# Patient Record
Sex: Male | Born: 1955 | Race: White | Hispanic: No | Marital: Married | Smoking: Never smoker
Health system: Southern US, Community
[De-identification: ages and names within clinical notes are randomized; demographics above are authoritative.]

## PROBLEM LIST (undated history)

## (undated) DIAGNOSIS — M1990 Unspecified osteoarthritis, unspecified site: Secondary | ICD-10-CM

## (undated) DIAGNOSIS — N200 Calculus of kidney: Secondary | ICD-10-CM

## (undated) DIAGNOSIS — E0590 Thyrotoxicosis, unspecified without thyrotoxic crisis or storm: Secondary | ICD-10-CM

## (undated) HISTORY — PX: ANKLE ARTHROPLASTY: SUR68

## (undated) HISTORY — PX: CARPAL TUNNEL RELEASE: SHX101

## (undated) HISTORY — DX: Thyrotoxicosis, unspecified without thyrotoxic crisis or storm: E05.90

## (undated) HISTORY — DX: Calculus of kidney: N20.0

## (undated) HISTORY — DX: Unspecified osteoarthritis, unspecified site: M19.90

## (undated) HISTORY — PX: TOTAL SHOULDER ARTHROPLASTY: SHX126

## (undated) HISTORY — PX: KNEE ARTHROSCOPY: SUR90

## (undated) HISTORY — PX: KNEE ARTHROPLASTY: SHX992

## (undated) HISTORY — PX: JOINT REPLACEMENT: SHX530

---

## 2002-07-14 ENCOUNTER — Encounter: Payer: Self-pay | Admitting: Rheumatology

## 2002-07-14 ENCOUNTER — Ambulatory Visit (HOSPITAL_COMMUNITY): Admission: RE | Admit: 2002-07-14 | Discharge: 2002-07-14 | Payer: Self-pay | Admitting: Rheumatology

## 2005-05-12 ENCOUNTER — Ambulatory Visit (HOSPITAL_BASED_OUTPATIENT_CLINIC_OR_DEPARTMENT_OTHER): Admission: RE | Admit: 2005-05-12 | Discharge: 2005-05-12 | Payer: Self-pay | Admitting: Orthopedic Surgery

## 2006-09-09 ENCOUNTER — Encounter: Admission: RE | Admit: 2006-09-09 | Discharge: 2006-09-09 | Payer: Self-pay | Admitting: Rheumatology

## 2006-10-04 ENCOUNTER — Ambulatory Visit (HOSPITAL_BASED_OUTPATIENT_CLINIC_OR_DEPARTMENT_OTHER): Admission: RE | Admit: 2006-10-04 | Discharge: 2006-10-04 | Payer: Self-pay | Admitting: Orthopedic Surgery

## 2010-09-20 NOTE — Op Note (Signed)
NAMEJEFFERIE, [name]                ACCOUNT NO.:  [id number]   MEDICAL RECORD NO.:  [id number]          PATIENT TYPE:  AMB   LOCATION:  DSC                          FACILITY:  [facility]   PHYSICIAN:  Rodney A. Mortenson, M.D.DATE OF BIRTH:  1955-07-16   DATE OF PROCEDURE:  10/04/2006  DATE OF DISCHARGE:                               OPERATIVE REPORT   PREOPERATIVE DIAGNOSES:  Right carpal tunnel syndrome; osteoarthritis  right acromioclavicular joint; impingement syndrome right shoulder with  secondary supraspinatus tendinitis and bursitis right shoulder.   PROCEDURE:  Release of right carpal tunnel; diagnostic arthroscopy right  shoulder with arthroscopic acromioplasty; right distal clavicle  resection through the arthroscope.   SURGEON:  Lenard Galloway. Chaney Malling, M.D.   ASSISTANT:  Arlys John D. Petrarca, P.A.-C.   ANESTHESIA:  General.   PROCEDURE:  The patient was placed on the operating table in the supine  position.  After satisfactory anesthesia, the patient was placed in the  semi-sitting position.  The entire right upper extremity including the  hand all the way up to the shoulder girdle was prepped with Betadine and  draped out in the usual manner.  Attention was first turned to the arm.  A sterile tourniquet was placed above the elbow and the hand was put on  a Mayo stand.  The arm was then wrapped out with an Esmarch and the  tourniquet was elevated.  Loupe magnification was used throughout.  A  lazy-S incision was started in the mid palmar space and carried  proximally to the volar wrist crease on the ulnar side of the midline.  Skin edges were retracted.  The fascia was opened and the median nerve  was identified and isolated.  A curved Mayo scissors was placed under  the transverse ligament but above the median nerve.  Using sharp  dissection the transverse ligament was then released off the ulnar  border of the carpal canal under direct vision throughout.  Complete  decompression of the transverse ligament was achieved.  There was loss  of vascular markings of the nerve underneath the transverse ligament,  but no significant compression of the nerve itself.  No other space-  occupying lesions were seen.  Skin edges were then closed with  interrupted 4-0 nylon suture.  Sterile dressings were applied and an  impervious stockinette was placed on the hand all way the way up to the  elbow and wrapped with a Coban.   Attention was now turned to the shoulder for the second operative  procedure.  An arthroscope was placed in the standard posterior portal,  and the glenohumeral joint was examined very carefully.  The articular  cartilage of the humeral head and the glenoid appeared normal.  The  entire glenoid labrum and the biceps tendon were intact.  The  subscapularis appeared normal.  There was some fraying of the  undersurface of the supraspinatus but no through-and-through thickness  tear was seen.  The arthroscope was then placed the subacromial space.  The bursa in the subacromial space was then cleaned out through a  lateral portal using the Arthrotek wand.  There was definite fraying of  the supraspinatus tendon in this area with a secondary bursitis.  No  through-and-through tear was seen.  The distal clavicle was then  isolated, and a needle was placed through the [facility] joint and the anterior  aspect of the acromion to set up appropriate landmarks.  Using a high-  speed bur a generous acromioplasty was then done.  This was swept  anteriorly and medially so the [facility] joint could clearly be seen.  Excellent decompression was achieved.  Bleeding was easily controlled  with the Arthrotek wand.  After adequate for exposure of the distal  clavicle and release of all the capsule around the distal clavicle, it  was felt that resection of the clavicle could be done through an  anterior portal.  An anterior portal was made and a 6-mm bur was  inserted.  Starting  anteriorly and working posteriorly from inferior to  superior, the 6-mm bur was used to resect the distal end of the  clavicle.  The arthroscope was changed to a lateral portal and then the  anterior portal to check completeness of the resection, and excellent  decompression of the distal clavicle was achieved.  There was a nice  trough between the distal clavicle and the acromion.  The capsule was  still stable dorsally.  The area of resection could be palpated through  the skin.  I was very pleased with the surgical outcome.  At this point  the scope was removed.  The wounds were closed with 4-0 nylon suture.  Sterile dressing were applied and the patient returned to the recovery  room in excellent condition.  Technically, the procedure went extremely  well.   Two surgical procedures were done at the same sitting as noted above.  The patient was returned to the recovery room in excellent condition.  Technically both procedures went extremely well.   DRAINS:  None.   COMPLICATIONS:  None.           ______________________________  Lenard Galloway. Chaney Malling, M.D.     RAM/MEDQ  D:  10/04/2006  T:  10/04/2006  Job:  [id number]

## 2010-09-23 NOTE — Op Note (Signed)
[name], [name]                ACCOUNT NO.:  [id number]   MEDICAL RECORD NO.:  [id number]          PATIENT TYPE:  AMB   LOCATION:  DSC                          FACILITY:  [facility]   PHYSICIAN:  Rodney A. Mortenson, M.D.DATE OF BIRTH:  [birth year]   DATE OF PROCEDURE:  05/12/2005  DATE OF DISCHARGE:                                 OPERATIVE REPORT   PREOPERATIVE DIAGNOSIS:  Nondisplaced full thickness rotator cuff tear, left  distal infraspinatus tendon, frayed superior labrum, left shoulder.   POSTOPERATIVE DIAGNOSIS:  Nondisplaced full thickness rotator cuff tear,  left distal infraspinatus tendon, frayed superior labrum, left shoulder.   OPERATION:  Arthroscopic evaluation of glenohumeral joint, debridement  superior labrum, open acromioplasty and open repair of degenerated torn  rotator cuff, left shoulder.   SURGEON:  Lenard Galloway. Chaney Malling, M.D.   ANESTHESIA:  General.   PROCEDURE:  The patient was placed on the operating table in supine  position.  The patient was then placed in a semi-seated position and the  left upper extremity was prepped with DuraPrep and draped out in the usual  manner.  Through the standard posterior portal, the arthroscope was  introduced.  The articular cartilage over the humeral head and glenoid  appeared normal.  The biceps tendon appeared normal but the biceps anchor  was frayed and torn but was not unstable.  The anterior labrum was then  examined, this was palpated and no tears or no instability about the labrum  were seen, but again, the superior part was quite frayed and torn up.  The  arthroscope was then passed laterally and the under surface of the  supraspinatus showed marked fraying and tearing.  Through an anterior  operative portal, an intra-articular shaver was introduced and the superior  labrum was debrided and the under surface of the infraspinatus was also  debrided.  With a spinal needle passed through the skin and into the  joint,  the area of the frayed and torn portion of the rotator cuff was marked with  the large spinal needle.  The arthroscope was removed from the shoulder.  A  saber cut incision was made over the anterolateral aspect of the shoulder.  The skin edges were retracted.  The fibrous deltoid released off the  anterior aspect of the acromion only.  The subacromial space was then  opened.  Using a power saw, a Neer acromioplasty was then done.  Once this  was accomplished, excellent access to the joint could be achieved.  Before  the subacromial space was opened, the arthroscope was placed in the  subacromial area and there was fraying of the infraspinatus in the area  where the needle had been placed through the skin and muscle into the intra-  articular portion.  This showed fraying and tearing that actually was full  thickness but not displaced.  The tear was seen at both surfaces, both the  articular surface and the bursal surface.  Again, once the shoulder was  opened, excellent access was achieved.  The spinal needle was in the  position of fraying and tearing.  The spinal needle was then removed.  A  portion of the rotator cuff at this point was then elliptically excised and  fraying and tearing and degenerative changes of the cuff were seen.  The  cuff was then sutured side-to-side with 0 Tycron sutures.  A watertight  closure was achieved.  No other significant pathology was seen.  The  shoulder was copiously irrigated with saline solution.  The deltoid fibers  were reattached with 0 Vicryl.  2-0 Vicryl was used to close the subcutaneous tissues and stainless steel  staples were used to close the skin.  A sterile dressing was applied.  The  patient returned to the recovery room in excellent condition.  Technically,  this procedure went extremely well.  Drains were none.  Complications none.           ______________________________  Lenard Galloway. Chaney Malling, M.D.     RAM/MEDQ  D:   05/12/2005  T:  05/12/2005  Job:  [id number]

## 2015-11-02 DIAGNOSIS — F339 Major depressive disorder, recurrent, unspecified: Secondary | ICD-10-CM | POA: Insufficient documentation

## 2015-11-02 DIAGNOSIS — M0600 Rheumatoid arthritis without rheumatoid factor, unspecified site: Secondary | ICD-10-CM | POA: Insufficient documentation

## 2015-11-02 DIAGNOSIS — R5381 Other malaise: Secondary | ICD-10-CM | POA: Insufficient documentation

## 2015-11-02 DIAGNOSIS — R5383 Other fatigue: Secondary | ICD-10-CM | POA: Insufficient documentation

## 2016-02-18 ENCOUNTER — Other Ambulatory Visit: Payer: Self-pay | Admitting: Orthopedic Surgery

## 2016-02-18 DIAGNOSIS — M1711 Unilateral primary osteoarthritis, right knee: Secondary | ICD-10-CM

## 2016-02-25 ENCOUNTER — Ambulatory Visit
Admission: RE | Admit: 2016-02-25 | Discharge: 2016-02-25 | Disposition: A | Discharge status: Home / Self Care | Payer: BC Managed Care – PPO | Source: Ambulatory Visit | Attending: Orthopedic Surgery | Admitting: Orthopedic Surgery

## 2016-02-25 DIAGNOSIS — M1711 Unilateral primary osteoarthritis, right knee: Secondary | ICD-10-CM

## 2016-04-13 DIAGNOSIS — G5602 Carpal tunnel syndrome, left upper limb: Secondary | ICD-10-CM | POA: Insufficient documentation

## 2016-08-22 DIAGNOSIS — M150 Primary generalized (osteo)arthritis: Secondary | ICD-10-CM | POA: Insufficient documentation

## 2016-08-22 DIAGNOSIS — M159 Polyosteoarthritis, unspecified: Secondary | ICD-10-CM | POA: Insufficient documentation

## 2017-01-10 IMAGING — CT CT ANKLE*R* W/O CM
2 of 5 series · 6 of 14 positions shown, 7 images · non-contrast
Comparison: None.

CLINICAL DATA: Right knee pain for years. Remote arthroscopic
surgery in 6555. Primary osteoarthritis, preoperative Conformis
protocol.

EXAM:
CT OF THE RIGHT ANKLE WITHOUT CONTRAST (LIMITED)
CT OF THE RIGHT KNEE WITHOUT CONTRAST
CT OF THE RIGHT HIP WITHOUT CONTRAST (LIMITED)
TECHNIQUE: Multidetector CT imaging of the right hip, knee, and ankle were
performed. Multiplanar reconstruction of the knee images were also
employed.

[Series 9: knee soft · axial · 0.39mm/px · z∈[-666,-506]mm · 3 of 130 slices shown]
[im 33/130  soft-tissue]
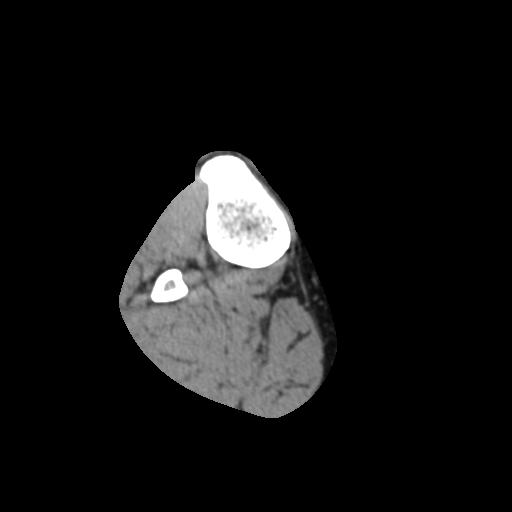
[im 65/130  soft-tissue]
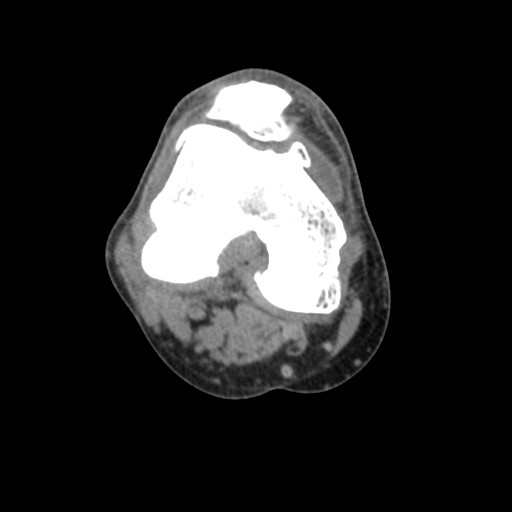
[im 97/130  soft-tissue]
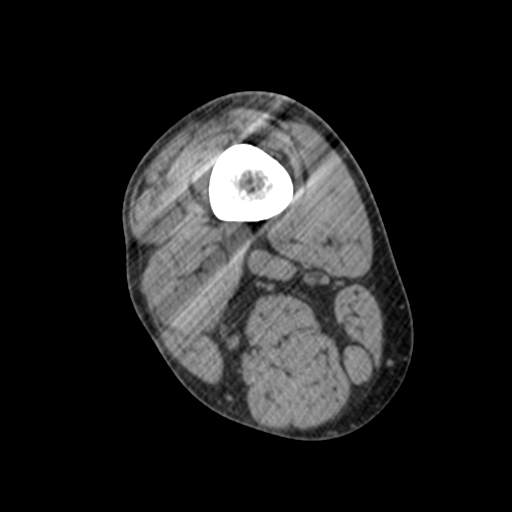

[Series 301: cor · axial · 0.39mm/px · z∈[-586,-420]mm · 3 of 75 slices shown, 4 images]
[im 1/75  soft-tissue]
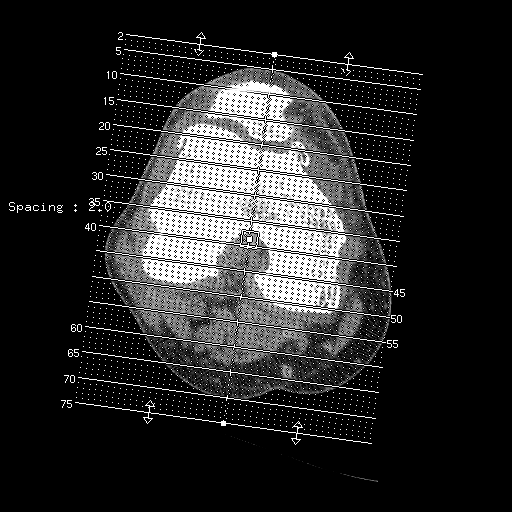
[im 1/75  bone]
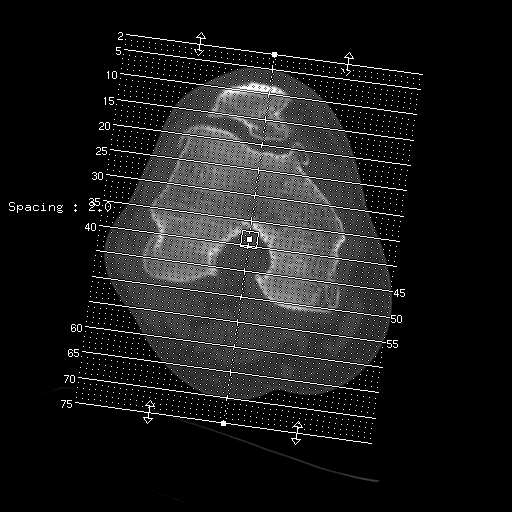
[im 38/75  bone]
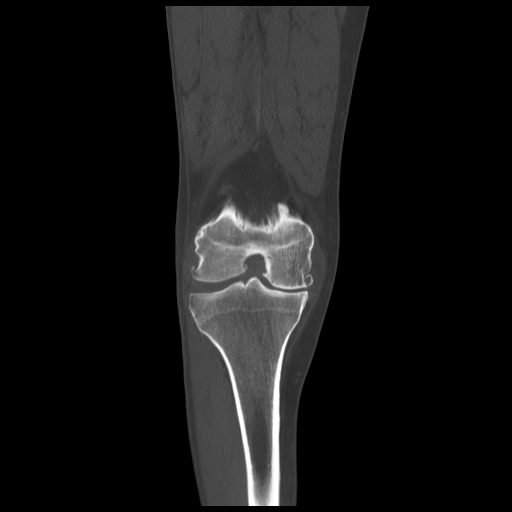
[im 75/75  bone]
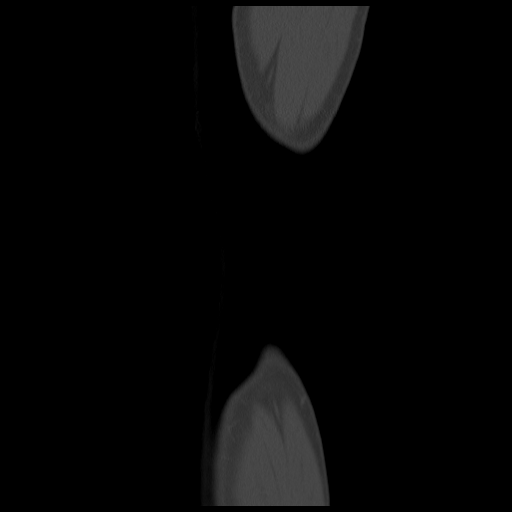

[6 of 14 positions shown; findings below may reference images not displayed]

FINDINGS: Right hip:

Mild degenerative arthropathy with spurring along the humeral head
and acetabulum. No hip effusion. Right inguinal mesh noted, there is
a indirect inguinal hernia extending along the medial margin of the
mesh containing adipose tissue, images 6-18 of series 5.

Right ankle:

Tunnel or unusual cystic lesion along the lateral malleolus,
correlate with operative history. Mildly fragmented spurring along
the tip of the medial malleolus. Probable peroneus tendinopathy or
tenosynovitis adjacent to the lateral malleolus.

Right knee: Striking tricompartmental spurring with markedly severe
loss of articular cartilage in the medial compartment and chondral
thinning also in the patellofemoral joint. Faint meniscal
chondrocalcinosis both both the medial and lateral menisci. Probable
erosive lesion with some cortical violation posteriorly in the
medial portion of the tibial plateau. Surrounding spurring noted.

Small to moderate knee effusion. Integrity ACL is uncertain.
Suspected pes anserine bursitis.
IMPRESSION: 1. Cystic lesion or erosion in the posterior aspect of the medial
tibial plateau/medial tibial metaphysis, near the semimembranosus
insertion site, with surrounding spurring.
2. Knee effusion with mild pes anserine bursitis.
3. Chondrocalcinosis with severe tricompartmental spurring, and loss
of articular space markedly severe in the medial compartment.
4. Right hip: There are mild degenerative findings, in addition to a
small recurrent indirect inguinal hernia extending along the lateral
margin of the mesh and containing adipose tissue.
5. Ankle: Tunnel or erosive/cystic lesion of the lateral malleolus,
correlate with any prior operative intervention in this vicinity.
Probable peroneus tendinopathy or tenosynovitis.

## 2017-01-15 DIAGNOSIS — M1990 Unspecified osteoarthritis, unspecified site: Secondary | ICD-10-CM | POA: Insufficient documentation
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
## 2017-01-23 ENCOUNTER — Ambulatory Visit (INDEPENDENT_AMBULATORY_CARE_PROVIDER_SITE_OTHER): Payer: BC Managed Care – PPO

## 2017-01-23 ENCOUNTER — Telehealth: Payer: Self-pay | Admitting: Radiology

## 2017-01-23 ENCOUNTER — Ambulatory Visit (INDEPENDENT_AMBULATORY_CARE_PROVIDER_SITE_OTHER): Payer: Self-pay

## 2017-01-23 ENCOUNTER — Ambulatory Visit (INDEPENDENT_AMBULATORY_CARE_PROVIDER_SITE_OTHER): Payer: BC Managed Care – PPO | Admitting: Rheumatology

## 2017-01-23 ENCOUNTER — Encounter: Payer: Self-pay | Admitting: Rheumatology

## 2017-01-23 VITALS — BP 150/82 | HR 78 | Resp 16 | Ht 70.0 in | Wt 176.0 lb

## 2017-01-23 DIAGNOSIS — N200 Calculus of kidney: Secondary | ICD-10-CM

## 2017-01-23 DIAGNOSIS — M19041 Primary osteoarthritis, right hand: Secondary | ICD-10-CM

## 2017-01-23 DIAGNOSIS — F419 Anxiety disorder, unspecified: Secondary | ICD-10-CM

## 2017-01-23 DIAGNOSIS — M79642 Pain in left hand: Secondary | ICD-10-CM

## 2017-01-23 DIAGNOSIS — M79672 Pain in left foot: Secondary | ICD-10-CM

## 2017-01-23 DIAGNOSIS — M79671 Pain in right foot: Secondary | ICD-10-CM | POA: Diagnosis not present

## 2017-01-23 DIAGNOSIS — M1990 Unspecified osteoarthritis, unspecified site: Secondary | ICD-10-CM | POA: Diagnosis not present

## 2017-01-23 DIAGNOSIS — F329 Major depressive disorder, single episode, unspecified: Secondary | ICD-10-CM | POA: Diagnosis not present

## 2017-01-23 DIAGNOSIS — Z79899 Other long term (current) drug therapy: Secondary | ICD-10-CM

## 2017-01-23 DIAGNOSIS — M170 Bilateral primary osteoarthritis of knee: Secondary | ICD-10-CM

## 2017-01-23 DIAGNOSIS — M79641 Pain in right hand: Secondary | ICD-10-CM

## 2017-01-23 DIAGNOSIS — Z96659 Presence of unspecified artificial knee joint: Secondary | ICD-10-CM

## 2017-01-23 DIAGNOSIS — M19042 Primary osteoarthritis, left hand: Secondary | ICD-10-CM | POA: Diagnosis not present

## 2017-01-23 NOTE — Telephone Encounter (Signed)
Attempted to contact the patient and left message for patient to call the office.  

## 2017-01-23 NOTE — Telephone Encounter (Signed)
Patient returning your call.

## 2017-01-23 NOTE — Telephone Encounter (Signed)
Left foot OA  Right foot A possible erosion noted over the fifth MTP joint.and OA changes  Left hand Possible erosion versus cystic changes were noted in the carpal bones and radius Right hand erosive changes ulnar styloid   Phone call made to patient to advise of the xray results per Dr Corliss Skains   Left message for him to call us back

## 2017-01-24 NOTE — Telephone Encounter (Signed)
Patient advised of results and verbalized understanding.  

## 2017-02-22 ENCOUNTER — Ambulatory Visit: Payer: Self-pay | Admitting: Rheumatology

## 2017-05-23 NOTE — Progress Notes (Signed)
Office Visit Note  Patient: [name]             Date of Birth: 1955/08/30           MRN: [id number]             PCP: Gordan Payment., MD Referring: Gordan Payment., MD Visit Date: 06/06/2017 Occupation: @GUAROCC @    Subjective:  Right hand pain   History of Present Illness: [name] is a 62 y.o. male with history of seronegative inflammatory arthritis and osteoarthritis. He states she's been having some swelling in his right hand over his MCPs. None of the other joints are swollen or painful. He had flu symptoms about a week ago. He has not been very active in the last 1-1/2 week.  Activities of Daily Living:  Patient reports morning stiffness for 0 minute.   Patient Denies nocturnal pain.  Difficulty dressing/grooming: Denies Difficulty climbing stairs: Denies Difficulty getting out of chair: Denies Difficulty using hands for taps, buttons, cutlery, and/or writing: Denies   Review of Systems  Constitutional: Negative for fatigue, night sweats and weakness ( ).  HENT: Negative for mouth sores, mouth dryness and nose dryness.   Eyes: Negative for redness and dryness.  Respiratory: Negative for shortness of breath and difficulty breathing.   Cardiovascular: Negative for chest pain, palpitations, hypertension, irregular heartbeat and swelling in legs/feet.  Gastrointestinal: Negative for constipation and diarrhea.  Endocrine: Negative for increased urination.  Musculoskeletal: Positive for arthralgias and joint pain. Negative for joint swelling, myalgias, muscle weakness, morning stiffness, muscle tenderness and myalgias.  Skin: Negative for color change, rash, hair loss, nodules/bumps, skin tightness, ulcers and sensitivity to sunlight.  Allergic/Immunologic: Negative for susceptible to infections.  Neurological: Negative for dizziness, fainting, memory loss and night sweats.  Hematological: Negative for swollen glands.  Psychiatric/Behavioral: Negative for  depressed mood and sleep disturbance. The patient is not nervous/anxious.     PMFS History:  Patient Active Problem List   Diagnosis Date Noted  . Seronegative Inflammatory arthritis 01/15/2017    Past Medical History:  Diagnosis Date  . Kidney stone     Family History  Problem Relation Age of Onset  . Cancer Mother        breast   Past Surgical History:  Procedure Laterality Date  . ANKLE ARTHROPLASTY Right    1989 right ankle reconstruction  . CARPAL TUNNEL RELEASE Bilateral   . JOINT REPLACEMENT     right knee partial   . JOINT REPLACEMENT     left knee partial  . KNEE ARTHROPLASTY    . KNEE ARTHROSCOPY     Social History   Social History Narrative  . Not on file     Objective: Vital Signs: BP 137/82 (BP Location: Left Arm, Patient Position: Sitting, Cuff Size: Normal)   Pulse 73   Resp 14   Ht 5\' 10"  (1.778 m)   Wt 174 lb 8 oz (79.2 kg)   BMI 25.04 kg/m    Physical Exam  Constitutional: He is oriented to person, place, and time. He appears well-developed and well-nourished.  HENT:  Head: Normocephalic and atraumatic.  Eyes: Conjunctivae and EOM are normal. Pupils are equal, round, and reactive to light.  Neck: Normal range of motion. Neck supple.  Cardiovascular: Normal rate, regular rhythm and normal heart sounds.  Pulmonary/Chest: Effort normal and breath sounds normal.  Abdominal: Soft. Bowel sounds are normal.  Neurological: He is alert and oriented to person, place, and time.  Skin: Skin is warm and dry. Capillary refill takes less than 2 seconds.  Psychiatric: He has a normal mood and affect. His behavior is normal.  Nursing note and vitals reviewed.    Musculoskeletal Exam: C-spine and thoracic lumbar spine good range of motion. He has some discomfort range of motion of his left shoulder. Elbow joints showed old a ground bursitis over his left elbow. He has synovitis over MCPs as described below. Hip joints are good range of motion. He has  bilateral total knee replacement with effusion and warmth in his left knee. He had no MTP PIP/DIP tenderness. He had recent injury to his left great toe which is causing discomfort.  CDAI Exam: CDAI Homunculus Exam:   Tenderness:  Right hand: 2nd MCP and 3rd MCP LLE: tibiofemoral  Swelling:  Right hand: 2nd MCP and 3rd MCP LLE: tibiofemoral  Joint Counts:  CDAI Tender Joint count: 3 CDAI Swollen Joint count: 3  Global Assessments:  Patient Global Assessment: 5 Provider Global Assessment: 5  CDAI Calculated Score: 16    Investigation: No additional findings. No flowsheet data found.   Imaging: No results found.  Speciality Comments: No specialty comments available.    Procedures:  No procedures performed Allergies: Patient has no known allergies.   Assessment / Plan:     Visit Diagnoses: Seronegative Inflammatory arthritis -  Patient was treated with SSZ 2012-2014 by me. Since then he's been getting sulfasalazine through his PCP.He's been having increased pain and swelling in his right hand. It active synovitis on examination. He also had effusion in his left knee and on the ground bursitis. He was treated with methotrexate in 2004. He discontinued it after a while. He recalls having no side effects from methotrexate. We discussed discussed the option of adding methotrexate to sulfasalazine and see how he does on combination therapy. He was in agreement. Handout was given and side effects were discussed. An informed consent was obtained today. I will obtain following labs today. Once the labs are normal he can start him on 6 tablets by mouth every week and increase it to 8 tablets by mouth every week along with folic acid 2 mg by mouth daily. I'll check his labs every 2 weeks 2 and then every 3 months. - Plan: Cyclic citrul peptide antibody, IgG, 14-3-3 eta Protein, Rheumatoid factor, Sedimentation rate  Drug Counseling TB Gold: pending Hepatitis panel:  pending  Chest-xray:  2004  Contraception: discussed  Alcohol use: discussed  Patient was counseled on the purpose, proper use, and adverse effects of methotrexate including nausea, infection, and signs and symptoms of pneumonitis.  Reviewed instructions with patient to take methotrexate weekly along with folic acid daily.  Discussed the importance of frequent monitoring of kidney and liver function and blood counts, and provided patient with standing lab instructions.  Counseled patient to avoid NSAIDs and alcohol while on methotrexate.  Provided patient with educational materials on methotrexate and answered all questions.  Advised patient to get annual influenza vaccine and to get a pneumococcal vaccine if patient has not already had one.  Patient voiced understanding.  Patient consented to methotrexate use.  Will upload into chart.    High risk medication use - Sulfasalazine 500 mg 2 tablets by mouth twice a day, Celebrex 200 mg 1 tablet by mouth daily - Plan: CBC with Differential/Platelet, COMPLETE METABOLIC PANEL WITH GFR, Hepatitis B core antibody, IgM, Hepatitis B surface antigen, Hepatitis C antibody, HIV antibody, QuantiFERON-TB Gold Plus, Serum protein electrophoresis with reflex,  IgG, IgA, IgM  Primary osteoarthritis of both hands: He has some pain and stiffness in his hands.  Olecranon bursitis, bilateral: Bursitis is more prominent in his left elbow than right.  History of partial knee replacement - Right partial knee replacement 2017, left partial knee replacement 2016. He continues to have some discomfort in his knee joints. His left knee joint has small effusion.  History of anxiety  History of depression  History of kidney stones    Orders: Orders Placed This Encounter  Procedures  . CBC with Differential/Platelet  . COMPLETE METABOLIC PANEL WITH GFR  . Cyclic citrul peptide antibody, IgG  . 14-3-3 eta Protein  . Rheumatoid factor  . Hepatitis B core antibody, IgM   . Hepatitis B surface antigen  . Hepatitis C antibody  . HIV antibody  . QuantiFERON-TB Gold Plus  . Serum protein electrophoresis with reflex  . IgG, IgA, IgM  . Sedimentation rate   No orders of the defined types were placed in this encounter.   Face-to-face time spent with patient was 30 minutes.greater than50% of time was spent in counseling and coordination of care.  Follow-Up Instructions: Return in about 3 months (around 09/04/2017) for Inflammatory arthritis.   [name], MD  Note - This record has been created using Animal nutritionist.  Chart creation errors have been sought, but may not always  have been located. Such creation errors do not reflect on  the standard of medical care.

## 2017-06-06 ENCOUNTER — Encounter: Payer: Self-pay | Admitting: Rheumatology

## 2017-06-06 ENCOUNTER — Ambulatory Visit: Payer: BC Managed Care – PPO | Admitting: Rheumatology

## 2017-06-06 VITALS — BP 137/82 | HR 73 | Resp 14 | Ht 70.0 in | Wt 174.5 lb

## 2017-06-06 DIAGNOSIS — M7021 Olecranon bursitis, right elbow: Secondary | ICD-10-CM | POA: Diagnosis not present

## 2017-06-06 DIAGNOSIS — M19041 Primary osteoarthritis, right hand: Secondary | ICD-10-CM | POA: Diagnosis not present

## 2017-06-06 DIAGNOSIS — M19042 Primary osteoarthritis, left hand: Secondary | ICD-10-CM

## 2017-06-06 DIAGNOSIS — Z79899 Other long term (current) drug therapy: Secondary | ICD-10-CM | POA: Diagnosis not present

## 2017-06-06 DIAGNOSIS — Z8659 Personal history of other mental and behavioral disorders: Secondary | ICD-10-CM

## 2017-06-06 DIAGNOSIS — Z96659 Presence of unspecified artificial knee joint: Secondary | ICD-10-CM | POA: Diagnosis not present

## 2017-06-06 DIAGNOSIS — M1990 Unspecified osteoarthritis, unspecified site: Secondary | ICD-10-CM | POA: Diagnosis not present

## 2017-06-06 DIAGNOSIS — Z87442 Personal history of urinary calculi: Secondary | ICD-10-CM

## 2017-06-06 NOTE — Patient Instructions (Signed)
Methotrexate tablets What is this medicine? METHOTREXATE (METH oh TREX ate) is a chemotherapy drug used to treat cancer including breast cancer, leukemia, and lymphoma. This medicine can also be used to treat psoriasis and certain kinds of arthritis. This medicine may be used for other purposes; ask your health care provider or pharmacist if you have questions. COMMON BRAND NAME(S): Rheumatrex, Trexall What should I tell my health care provider before I take this medicine? They need to know if you have any of these conditions: -fluid in the stomach area or lungs -if you often drink alcohol -infection or immune system problems -kidney disease or on hemodialysis -liver disease -low blood counts, like low white cell, platelet, or red cell counts -lung disease -radiation therapy -stomach ulcers -ulcerative colitis -an unusual or allergic reaction to methotrexate, other medicines, foods, dyes, or preservatives -pregnant or trying to get pregnant -breast-feeding How should I use this medicine? Take this medicine by mouth with a glass of water. Follow the directions on the prescription label. Take your medicine at regular intervals. Do not take it more often than directed. Do not stop taking except on your doctor's advice. Make sure you know why you are taking this medicine and how often you should take it. If this medicine is used for a condition that is not cancer, like arthritis or psoriasis, it should be taken weekly, NOT daily. Taking this medicine more often than directed can cause serious side effects, even death. Talk to your healthcare provider about safe handling and disposal of this medicine. You may need to take special precautions. Talk to your pediatrician regarding the use of this medicine in children. While this drug may be prescribed for selected conditions, precautions do apply. Overdosage: If you think you have taken too much of this medicine contact a poison control center or  emergency room at once. NOTE: This medicine is only for you. Do not share this medicine with others. What if I miss a dose? If you miss a dose, talk with your doctor or health care professional. Do not take double or extra doses. What may interact with this medicine? This medicine may interact with the following medication: -acitretin -aspirin and aspirin-like medicines including salicylates -azathioprine -certain antibiotics like penicillins, tetracycline, and chloramphenicol -cyclosporine -gold -hydroxychloroquine -live virus vaccines -NSAIDs, medicines for pain and inflammation, like ibuprofen or naproxen -other cytotoxic agents -penicillamine -phenylbutazone -phenytoin -probenecid -retinoids such as isotretinoin and tretinoin -steroid medicines like prednisone or cortisone -sulfonamides like sulfasalazine and trimethoprim/sulfamethoxazole -theophylline This list may not describe all possible interactions. Give your health care provider a list of all the medicines, herbs, non-prescription drugs, or dietary supplements you use. Also tell them if you smoke, drink alcohol, or use illegal drugs. Some items may interact with your medicine. What should I watch for while using this medicine? Avoid alcoholic drinks. This medicine can make you more sensitive to the sun. Keep out of the sun. If you cannot avoid being in the sun, wear protective clothing and use sunscreen. Do not use sun lamps or tanning beds/booths. You may need blood work done while you are taking this medicine. Call your doctor or health care professional for advice if you get a fever, chills or sore throat, or other symptoms of a cold or flu. Do not treat yourself. This drug decreases your body's ability to fight infections. Try to avoid being around people who are sick. This medicine may increase your risk to bruise or bleed. Call your doctor or health care professional   if you notice any unusual bleeding. Check with your  doctor or health care professional if you get an attack of severe diarrhea, nausea and vomiting, or if you sweat a lot. The loss of too much body fluid can make it dangerous for you to take this medicine. Talk to your doctor about your risk of cancer. You may be more at risk for certain types of cancers if you take this medicine. Both men and women must use effective birth control with this medicine. Do not become pregnant while taking this medicine or until at least 1 normal menstrual cycle has occurred after stopping it. Women should inform their doctor if they wish to become pregnant or think they might be pregnant. Men should not father a child while taking this medicine and for 3 months after stopping it. There is a potential for serious side effects to an unborn child. Talk to your health care professional or pharmacist for more information. Do not breast-feed an infant while taking this medicine. What side effects may I notice from receiving this medicine? Side effects that you should report to your doctor or health care professional as soon as possible: -allergic reactions like skin rash, itching or hives, swelling of the face, lips, or tongue -breathing problems or shortness of breath -diarrhea -dry, nonproductive cough -low blood counts - this medicine may decrease the number of white blood cells, red blood cells and platelets. You may be at increased risk for infections and bleeding. -mouth sores -redness, blistering, peeling or loosening of the skin, including inside the mouth -signs of infection - fever or chills, cough, sore throat, pain or trouble passing urine -signs and symptoms of bleeding such as bloody or black, tarry stools; red or dark-brown urine; spitting up blood or brown material that looks like coffee grounds; red spots on the skin; unusual bruising or bleeding from the eye, gums, or nose -signs and symptoms of kidney injury like trouble passing urine or change in the amount  of urine -signs and symptoms of liver injury like dark yellow or brown urine; general ill feeling or flu-like symptoms; light-colored stools; loss of appetite; nausea; right upper belly pain; unusually weak or tired; yellowing of the eyes or skin Side effects that usually do not require medical attention (report to your doctor or health care professional if they continue or are bothersome): -dizziness -hair loss -tiredness -upset stomach -vomiting This list may not describe all possible side effects. Call your doctor for medical advice about side effects. You may report side effects to FDA at 1-800-FDA-1088. Where should I keep my medicine? Keep out of the reach of children. Store at room temperature between 20 and 25 degrees C (68 and 77 degrees F). Protect from light. Throw away any unused medicine after the expiration date. NOTE: This sheet is a summary. It may not cover all possible information. If you have questions about this medicine, talk to your doctor, pharmacist, or health care provider.  2018 Elsevier/Gold Standard (2014-12-28 05:39:22) Standing Labs We placed an order today for your standing lab work.    Please come back and get your standing labs in very 2 weeks 2 and then every 3 months  We have open lab Monday through Friday from 8:30-11:30 AM and 1:30-4 PM at the office of Dr. Pollyann SavoyShaili Dera Vanaken.   The office is located at [address], [address], [place], [place] [id number] No appointment is necessary.   Labs are drawn by [facility].  You may receive a bill from  [facility] for your lab work. If you have any questions regarding directions or hours of operation,  please call [phone].

## 2017-06-08 NOTE — Progress Notes (Signed)
Okay to start him on methotrexate.  He will start on 6 tablets/week and increase it to 8 tablets/week if his labs are normal.  Lab checked every 2 weeks x2 and then every 2 months.

## 2017-06-11 ENCOUNTER — Telehealth: Payer: Self-pay | Admitting: Physician Assistant

## 2017-06-11 ENCOUNTER — Other Ambulatory Visit: Payer: Self-pay | Admitting: Physician Assistant

## 2017-06-11 DIAGNOSIS — Z79899 Other long term (current) drug therapy: Secondary | ICD-10-CM

## 2017-06-11 LAB — COMPLETE METABOLIC PANEL WITH GFR
AG Ratio: 1.7 (calc) (ref 1.0–2.5)
ALKALINE PHOSPHATASE (APISO): 60 U/L (ref 40–115)
ALT: 16 U/L (ref 9–46)
AST: 16 U/L (ref 10–35)
Albumin: 4 g/dL (ref 3.6–5.1)
BUN: 23 mg/dL (ref 7–25)
CHLORIDE: 108 mmol/L (ref 98–110)
CO2: 31 mmol/L (ref 20–32)
CREATININE: 0.94 mg/dL (ref 0.70–1.25)
Calcium: 9.3 mg/dL (ref 8.6–10.3)
GFR, EST AFRICAN AMERICAN: 100 mL/min/{1.73_m2} (ref >=60)
GFR, Est Non African American: 87 mL/min/{1.73_m2} (ref >=60)
GLUCOSE: 94 mg/dL (ref 65–99)
Globulin: 2.3 g/dL (calc) (ref 1.9–3.7)
Potassium: 4.2 mmol/L (ref 3.5–5.3)
Sodium: 144 mmol/L (ref 135–146)
TOTAL PROTEIN: 6.3 g/dL (ref 6.1–8.1)
Total Bilirubin: 0.6 mg/dL (ref 0.2–1.2)

## 2017-06-11 LAB — CBC WITH DIFFERENTIAL/PLATELET
BASOS PCT: 0.5 %
Basophils Absolute: 33 cells/uL (ref 0–200)
EOS PCT: 0.9 %
Eosinophils Absolute: 59 cells/uL (ref 15–500)
HCT: 39.7 % (ref 38.5–50.0)
Hemoglobin: 13.2 g/dL (ref 13.2–17.1)
Lymphs Abs: 1360 cells/uL (ref 850–3900)
MCH: 31.7 pg (ref 27.0–33.0)
MCHC: 33.2 g/dL (ref 32.0–36.0)
MCV: 95.2 fL (ref 80.0–100.0)
MONOS PCT: 11.7 %
MPV: 10 fL (ref 7.5–12.5)
NEUTROS ABS: 4376 {cells}/uL (ref 1500–7800)
Neutrophils Relative %: 66.3 %
PLATELETS: 328 10*3/uL (ref 140–400)
RBC: 4.17 10*6/uL — ABNORMAL LOW (ref 4.20–5.80)
RDW: 12.7 % (ref 11.0–15.0)
TOTAL LYMPHOCYTE: 20.6 %
WBC mixed population: 772 cells/uL (ref 200–950)
WBC: 6.6 10*3/uL (ref 3.8–10.8)

## 2017-06-11 LAB — 14-3-3 ETA PROTEIN: 14-3-3 eta Protein: <0.2 ng/mL (ref <0.2)

## 2017-06-11 LAB — QUANTIFERON-TB GOLD PLUS
Mitogen-NIL: >10 IU/mL
NIL: 0.07 IU/mL
QuantiFERON-TB Gold Plus: NEGATIVE
TB1-NIL: <0 IU/mL

## 2017-06-11 LAB — RHEUMATOID FACTOR: Rhuematoid fact SerPl-aCnc: <14 IU/mL (ref <14)

## 2017-06-11 LAB — HEPATITIS B SURFACE ANTIGEN: HEP B S AG: NONREACTIVE

## 2017-06-11 LAB — PROTEIN ELECTROPHORESIS, SERUM, WITH REFLEX
ALBUMIN ELP: 3.9 g/dL (ref 3.8–4.8)
Alpha 1: 0.3 g/dL (ref 0.2–0.3)
Alpha 2: 0.8 g/dL (ref 0.5–0.9)
BETA GLOBULIN: 0.4 g/dL (ref 0.4–0.6)
Beta 2: 0.4 g/dL (ref 0.2–0.5)
Gamma Globulin: 0.6 g/dL — ABNORMAL LOW (ref 0.8–1.7)
TOTAL PROTEIN: 6.4 g/dL (ref 6.1–8.1)

## 2017-06-11 LAB — CYCLIC CITRUL PEPTIDE ANTIBODY, IGG: Cyclic Citrullin Peptide Ab: <16 UNITS

## 2017-06-11 LAB — IFE INTERPRETATION: IMMUNOFIX ELECTR INT: NOT DETECTED

## 2017-06-11 LAB — HEPATITIS C ANTIBODY
HEP C AB: NONREACTIVE
SIGNAL TO CUT-OFF: 0.02 (ref <1.00)

## 2017-06-11 LAB — IGG, IGA, IGM
IgG (Immunoglobin G), Serum: 638 mg/dL — ABNORMAL LOW (ref 694–1618)
IgM, Serum: 85 mg/dL (ref 48–271)
Immunoglobulin A: 179 mg/dL (ref 81–463)

## 2017-06-11 LAB — HIV ANTIBODY (ROUTINE TESTING W REFLEX): HIV: NONREACTIVE

## 2017-06-11 LAB — SEDIMENTATION RATE: SED RATE: 33 mm/h — AB (ref 0–20)

## 2017-06-11 LAB — HEPATITIS B CORE ANTIBODY, IGM: Hep B C IgM: NONREACTIVE

## 2017-06-11 MED ORDER — FOLIC ACID 1 MG PO TABS: 2.0000 mg | ORAL_TABLET | Freq: Every day | ORAL | 2 refills | Status: DC

## 2017-06-11 MED ORDER — METHOTREXATE 2.5 MG PO TABS: ORAL_TABLET | ORAL | 0 refills | Status: DC

## 2017-06-11 NOTE — Telephone Encounter (Signed)
I called [name] to discuss his recent lab work.  He will be started on Methotrexate 6 tablets by mouth once weekly for 2 weeks.  We will recheck labs in 2 weeks.  If his labs are stable at that time, we will increase his dose to 8 tablets by mouth once weekly.  Then recheck labs in 2 weeks and then every 2 months.  He will also need to take Folic acid 2 mg by mouth daily.  Prescriptions were sent to his pharmacy [facility] in [place].  All questions were addressed.   Standing orders for CBC and CMP are in place.

## 2017-06-13 ENCOUNTER — Other Ambulatory Visit: Payer: BC Managed Care – PPO | Admitting: Rheumatology

## 2017-07-16 ENCOUNTER — Other Ambulatory Visit: Payer: Self-pay | Admitting: Physician Assistant

## 2017-07-17 NOTE — Telephone Encounter (Addendum)
Patient does not need refill at this time

## 2017-07-18 ENCOUNTER — Ambulatory Visit (INDEPENDENT_AMBULATORY_CARE_PROVIDER_SITE_OTHER): Payer: BC Managed Care – PPO | Admitting: Rheumatology

## 2017-07-18 ENCOUNTER — Ambulatory Visit (INDEPENDENT_AMBULATORY_CARE_PROVIDER_SITE_OTHER): Payer: Self-pay

## 2017-07-18 ENCOUNTER — Other Ambulatory Visit: Payer: Self-pay | Admitting: *Deleted

## 2017-07-18 DIAGNOSIS — Z79899 Other long term (current) drug therapy: Secondary | ICD-10-CM

## 2017-07-18 DIAGNOSIS — M79642 Pain in left hand: Secondary | ICD-10-CM

## 2017-07-18 DIAGNOSIS — M79641 Pain in right hand: Secondary | ICD-10-CM

## 2017-07-18 NOTE — Progress Notes (Signed)
The ultrasound examination today showed mild synovitis and synovial thickening in the right second third and fifth MCP joints and bilateral wrist joints.  And erosive change was noted in the left ulnar styloid.  He has been having symptoms of carpal tunnel syndrome in his right hand off and on.  He has had bilateral carpal tunnel release in the past.  The right median nerve was not enlarged.  He recently started methotrexate at 6 tablets p.o. weekly.  He will be increasing the dose after the labs.  Would like to see response to methotrexate before making any further changes.  We also discussed the option of subcu methotrexate. Pollyann SavoyShaili Kristyne Woodring, MD

## 2017-07-19 LAB — COMPLETE METABOLIC PANEL WITH GFR
AG RATIO: 2.6 (calc) — AB (ref 1.0–2.5)
ALT: 15 U/L (ref 9–46)
AST: 15 U/L (ref 10–35)
Albumin: 4.7 g/dL (ref 3.6–5.1)
Alkaline phosphatase (APISO): 59 U/L (ref 40–115)
BILIRUBIN TOTAL: 0.7 mg/dL (ref 0.2–1.2)
BUN: 23 mg/dL (ref 7–25)
CO2: 32 mmol/L (ref 20–32)
Calcium: 9.8 mg/dL (ref 8.6–10.3)
Chloride: 105 mmol/L (ref 98–110)
Creat: 1.12 mg/dL (ref 0.70–1.25)
GFR, EST AFRICAN AMERICAN: 81 mL/min/{1.73_m2} (ref >=60)
GFR, Est Non African American: 70 mL/min/{1.73_m2} (ref >=60)
GLUCOSE: 100 mg/dL — AB (ref 65–99)
Globulin: 1.8 g/dL (calc) — ABNORMAL LOW (ref 1.9–3.7)
POTASSIUM: 4.1 mmol/L (ref 3.5–5.3)
Sodium: 142 mmol/L (ref 135–146)
TOTAL PROTEIN: 6.5 g/dL (ref 6.1–8.1)

## 2017-07-19 LAB — CBC WITH DIFFERENTIAL/PLATELET
BASOS ABS: 50 {cells}/uL (ref 0–200)
Basophils Relative: 0.9 %
EOS PCT: 1.3 %
Eosinophils Absolute: 72 cells/uL (ref 15–500)
HCT: 39.4 % (ref 38.5–50.0)
Hemoglobin: 13.6 g/dL (ref 13.2–17.1)
Lymphs Abs: 1529 cells/uL (ref 850–3900)
MCH: 32.5 pg (ref 27.0–33.0)
MCHC: 34.5 g/dL (ref 32.0–36.0)
MCV: 94.3 fL (ref 80.0–100.0)
MONOS PCT: 8.5 %
MPV: 10.3 fL (ref 7.5–12.5)
NEUTROS PCT: 61.5 %
Neutro Abs: 3383 cells/uL (ref 1500–7800)
PLATELETS: 261 10*3/uL (ref 140–400)
RBC: 4.18 10*6/uL — ABNORMAL LOW (ref 4.20–5.80)
RDW: 13.7 % (ref 11.0–15.0)
TOTAL LYMPHOCYTE: 27.8 %
WBC mixed population: 468 cells/uL (ref 200–950)
WBC: 5.5 10*3/uL (ref 3.8–10.8)

## 2017-07-19 NOTE — Progress Notes (Signed)
Labs are stable.

## 2017-07-24 ENCOUNTER — Telehealth: Payer: Self-pay | Admitting: Rheumatology

## 2017-07-24 ENCOUNTER — Other Ambulatory Visit: Payer: Self-pay | Admitting: *Deleted

## 2017-07-24 MED ORDER — METHOTREXATE 2.5 MG PO TABS: 20.0000 mg | ORAL_TABLET | ORAL | 2 refills | Status: DC

## 2017-07-24 NOTE — Telephone Encounter (Signed)
Patient needs refill on MTX  Last visit: 06/06/17 Next Visit: 09/04/17 Labs: 07/18/17 Stable  Okay to refill per Dr. Corliss Skainseveshwar

## 2017-07-24 NOTE — Telephone Encounter (Signed)
Patient ruc. °

## 2017-08-21 NOTE — Progress Notes (Signed)
Office Visit Note  Patient: [name]             Date of Birth: [birth year]           MRN: [id number]             PCP: Gordan Payment., MD Referring: Gordan Payment., MD Visit Date: 09/04/2017 Occupation: @GUAROCC @    Subjective:  Right hand swelling    History of Present Illness: CHANEY MACLAREN is a 62 y.o. male with history of seronegative rheumatoid arthritis and osteoarthritis.  Patient states he has been on 8 tablets of methotrexate since mid March.  He denies any side effects.  He continues to take sulfasalazine 2 tablets twice daily.  He states that overall he has noticed improvement since starting on methotrexate.  He states that he notices the most benefit one day after taking his dose.He states that he feels his dose of MTX is not lasting him an entire week.  He states that he continues to have pain and swelling in his right hand.  He states his left hand will swell occasionally.  He said yesterday he did physically demanding work which caused increased swelling.  He states that his bilateral olecranon bursitis has improved significantly.    Activities of Daily Living:  Patient reports morning stiffness for 0 none.   Patient Denies nocturnal pain.  Difficulty dressing/grooming: Denies Difficulty climbing stairs: Denies Difficulty getting out of chair: Denies Difficulty using hands for taps, buttons, cutlery, and/or writing: Reports   Review of Systems  Constitutional: Negative for fatigue and night sweats.  HENT: Negative for mouth sores, mouth dryness and nose dryness.   Eyes: Negative for redness and dryness.  Respiratory: Negative for shortness of breath and difficulty breathing.   Cardiovascular: Negative for chest pain, palpitations, hypertension, irregular heartbeat and swelling in legs/feet.  Gastrointestinal: Negative for blood in stool, constipation and diarrhea.  Endocrine: Negative for cold intolerance and increased urination.  Genitourinary: Negative  for difficulty urinating and painful urination.  Musculoskeletal: Positive for arthralgias, joint pain, joint swelling and muscle tenderness. Negative for myalgias, muscle weakness, morning stiffness and myalgias.  Skin: Negative for color change, rash, hair loss, nodules/bumps, skin tightness, ulcers and sensitivity to sunlight.  Allergic/Immunologic: Negative for susceptible to infections.  Neurological: Negative for dizziness, fainting, memory loss, night sweats and weakness.  Hematological: Negative for swollen glands.  Psychiatric/Behavioral: Negative for depressed mood and sleep disturbance. The patient is not nervous/anxious.     PMFS History:  Patient Active Problem List   Diagnosis Date Noted  . Seronegative Inflammatory arthritis 01/15/2017    Past Medical History:  Diagnosis Date  . Arthritis   . Kidney stone     Family History  Problem Relation Age of Onset  . Cancer Mother        breast   Past Surgical History:  Procedure Laterality Date  . ANKLE ARTHROPLASTY Right    1989 right ankle reconstruction  . CARPAL TUNNEL RELEASE Bilateral   . JOINT REPLACEMENT     right knee partial   . JOINT REPLACEMENT     left knee partial  . KNEE ARTHROPLASTY    . KNEE ARTHROSCOPY    . TOTAL SHOULDER ARTHROPLASTY     Social History   Social History Narrative  . Not on file     Objective: Vital Signs: BP 120/67 (BP Location: Left Arm, Patient Position: Sitting, Cuff Size: Normal)   Pulse 64   Resp 14  Ht 5\' 10"  (1.778 m)   Wt 174 lb (78.9 kg)   BMI 24.97 kg/m    Physical Exam  Constitutional: He is oriented to person, place, and time. He appears well-developed and well-nourished.  HENT:  Head: Normocephalic and atraumatic.  Eyes: Pupils are equal, round, and reactive to light. Conjunctivae and EOM are normal.  Neck: Normal range of motion. Neck supple.  Cardiovascular: Normal rate, regular rhythm and normal heart sounds.  Pulmonary/Chest: Effort normal and  breath sounds normal.  Abdominal: Soft. Bowel sounds are normal.  Lymphadenopathy:    He has no cervical adenopathy.  Neurological: He is alert and oriented to person, place, and time.  Skin: Skin is warm and dry. Capillary refill takes less than 2 seconds.  Psychiatric: He has a normal mood and affect. His behavior is normal.  Nursing note and vitals reviewed.    Musculoskeletal Exam: C-spine, thoracic spine, and lumbar spine good ROM.  Shoulder joints, elbow joints, wrist joints, MCPs, PIPs, and DIPs good ROM. Synovitis of 2nd and 3rd MCP joint bilaterally.  PIP and DIP synovial thickening.  Hip joints, knee joints, ankle joints, MTPs,PIPs, and DIPs good ROM with no synovitis.  Slight warmth of bilateral knee joints.  No tenderness of trochanteric bursa.  CDAI Exam: CDAI Homunculus Exam:   Swelling:  Right hand: 2nd MCP and 3rd MCP Left hand: 2nd MCP and 3rd MCP  Joint Counts:  CDAI Tender Joint count: 0 CDAI Swollen Joint count: 4  Global Assessments:  Patient Global Assessment: 3 Provider Global Assessment: 3  CDAI Calculated Score: 10    Investigation: No additional findings. CBC Latest Ref Rng & Units 07/18/2017 06/06/2017  WBC 3.8 - 10.8 Thousand/uL 5.5 6.6  Hemoglobin 13.2 - 17.1 g/dL 40.913.6 81.113.2  Hematocrit 91.438.5 - 50.0 % 39.4 39.7  Platelets 140 - 400 Thousand/uL 261 328   CMP Latest Ref Rng & Units 07/18/2017 06/06/2017 06/06/2017  Glucose 65 - 99 mg/dL 782(N100(H) - 94  BUN 7 - 25 mg/dL 23 - 23  Creatinine 5.620.70 - 1.25 mg/dL 1.301.12 - 8.650.94  Sodium 784135 - 146 mmol/L 142 - 144  Potassium 3.5 - 5.3 mmol/L 4.1 - 4.2  Chloride 98 - 110 mmol/L 105 - 108  CO2 20 - 32 mmol/L 32 - 31  Calcium 8.6 - 10.3 mg/dL 9.8 - 9.3  Total Protein 6.1 - 8.1 g/dL 6.5 6.4 6.3  Total Bilirubin 0.2 - 1.2 mg/dL 0.7 - 0.6  AST 10 - 35 U/L 15 - 16  ALT 9 - 46 U/L 15 - 16    Imaging: No results found.  Speciality Comments: No specialty comments available.    Procedures:  No procedures  performed Allergies: Patient has no known allergies.   Assessment / Plan:     Visit Diagnoses: Seronegative Inflammatory arthritis: He has synovitis of second and third MCP joints bilaterally.  He has been taking methotrexate 8 tablets weekly, folic acid 2 mg daily, sulfasalazine 500 mg 2 tablets twice daily.  He continues to take Celebrex 200 mg 1 tablet daily.  We are going to do a prior authorization to see if he can be started on Rasuvo.  He was given a co-pay card today.  He was instructed on how to use the pen injectables.  All questions were addressed.  High risk medication use - MTX 8 tablets weekly, folic acid 2 mg daily, SSZ 696500 mg 2 tablets BID, Celebrex 200 mg 1 tablet daily CBC and CMP: 07/18/17.  He had  lab work with his PCP on August 10, 2017.  He will be due for labs in July and every 3 months.  Primary osteoarthritis of both hands: He has PIP and DIP synovial thickening consistent with zoster arthritis.  Joint protection muscle strengthening were discussed.  Olecranon bursitis of both elbows: No warmth or swelling.  No tenderness on exam.  His bilateral olecranon bursitis has improved significantly.  History of partial knee replacement: Doing well.  He has slight warmth of bilateral knees.  No effusion noted.  Other medical conditions are listed as follows:  Anxiety and depression  History of kidney stones    Orders: No orders of the defined types were placed in this encounter.  No orders of the defined types were placed in this encounter.   Face-to-face time spent with patient was 30 minutes.> 50% of time was spent in counseling and coordination of care.  Follow-Up Instructions: Return in about 5 months (around 02/04/2018) for Seronegative inflammatory arthritis, Osteoarthritis.   Gearldine Bienenstock, PA-C I examined and evaluated the patient with Sherron Ales PA.  Patient had some synovitis in his right second and third MCP.  Which he relates to doing some extra strenuous  work recently.  After detailed discussion we decided to switch him to subcu methotrexate.  We will see response to that.  The plan of care was discussed as noted above.  [name], MD Note - This record has been created using Animal nutritionist.  Chart creation errors have been sought, but may not always  have been located. Such creation errors do not reflect on  the standard of medical care.

## 2017-09-04 ENCOUNTER — Other Ambulatory Visit: Payer: Self-pay | Admitting: Physician Assistant

## 2017-09-04 ENCOUNTER — Telehealth: Payer: Self-pay

## 2017-09-04 ENCOUNTER — Ambulatory Visit: Payer: BC Managed Care – PPO | Admitting: Rheumatology

## 2017-09-04 ENCOUNTER — Encounter: Payer: Self-pay | Admitting: Physician Assistant

## 2017-09-04 VITALS — BP 120/67 | HR 64 | Resp 14 | Ht 70.0 in | Wt 174.0 lb

## 2017-09-04 DIAGNOSIS — M7021 Olecranon bursitis, right elbow: Secondary | ICD-10-CM

## 2017-09-04 DIAGNOSIS — M1990 Unspecified osteoarthritis, unspecified site: Secondary | ICD-10-CM | POA: Diagnosis not present

## 2017-09-04 DIAGNOSIS — F329 Major depressive disorder, single episode, unspecified: Secondary | ICD-10-CM

## 2017-09-04 DIAGNOSIS — Z87442 Personal history of urinary calculi: Secondary | ICD-10-CM | POA: Diagnosis not present

## 2017-09-04 DIAGNOSIS — M19042 Primary osteoarthritis, left hand: Secondary | ICD-10-CM

## 2017-09-04 DIAGNOSIS — M7022 Olecranon bursitis, left elbow: Secondary | ICD-10-CM | POA: Diagnosis not present

## 2017-09-04 DIAGNOSIS — Z79899 Other long term (current) drug therapy: Secondary | ICD-10-CM

## 2017-09-04 DIAGNOSIS — M19041 Primary osteoarthritis, right hand: Secondary | ICD-10-CM | POA: Diagnosis not present

## 2017-09-04 DIAGNOSIS — Z96659 Presence of unspecified artificial knee joint: Secondary | ICD-10-CM | POA: Diagnosis not present

## 2017-09-04 DIAGNOSIS — F419 Anxiety disorder, unspecified: Secondary | ICD-10-CM | POA: Diagnosis not present

## 2017-09-04 MED ORDER — METHOTREXATE SODIUM CHEMO INJECTION 50 MG/2ML: 20.0000 mg | INTRAMUSCULAR | 0 refills | Status: DC

## 2017-09-04 MED ORDER — "TUBERCULIN-ALLERGY SYRINGES 27G X 1/2"" 1 ML MISC": 3 refills | Status: DC

## 2017-09-04 NOTE — Patient Instructions (Signed)
Standing Labs We placed an order today for your standing lab work.    Please come back and get your standing labs in June and every 3 months  We have open lab Monday through Friday from 8:30-11:30 AM and 1:30-4:00 PM  at the office of Dr. Shaili Deveshwar.   You may experience shorter wait times on Monday and Friday afternoons. The office is located at [address], [address], [place], [place] [address] No appointment is necessary.   Labs are drawn by [facility].  You may receive a bill from [facility] for your lab work. If you have any questions regarding directions or hours of operation,  please call [phone].    

## 2017-09-04 NOTE — Telephone Encounter (Signed)
Was asked to submit a prior authorization for MTX injectable pen for pt. (Rasuvo/Otrexup). Authorization submitted to pts plan via cover my meds. Authorization has been approved.   Called plans to get estimated price for pts. Spoke with Sue Lush who states that Otrexup is $1345.03/month and Rasuvo is $250/month. Copay card were given to pt for both during visit. Otrexup will pay up to $250/month and Rasuvo will pay up to $125/month.   Called pt to update. Both copay are too expensive for pt. Discussed the vial and syringe option. Ran a test claim using the [facility]. Co-pay came back a lot reasonable. We would have to schedule a nursing visit to show pt how to draw up the medicine and inject himself. Patient voices understanding and denies any questions at this time.   Can we changed pts Rx to MTX Vial/syringe? If so, please send new Rx to [facility] in [place] and schedule nursing visit . Thanks!  Phoebie Shad, [place], CPhT 11:22 AM

## 2017-09-04 NOTE — Addendum Note (Signed)
Addended by: Henriette Combs on: 09/04/2017 04:15 PM   Modules accepted: Orders

## 2017-09-04 NOTE — Telephone Encounter (Signed)
I sent the prescription for MTX vial/syringe to the pharmacy.  He will schedule a nurse visit with Sue Lush when he is ready to start.

## 2017-09-19 ENCOUNTER — Other Ambulatory Visit: Payer: Self-pay | Admitting: Physician Assistant

## 2017-09-19 NOTE — Telephone Encounter (Signed)
Last Visit: 09/04/17 Next Visit: 02/05/18  Okay to refill per Dr. Corliss Skains

## 2017-11-23 ENCOUNTER — Telehealth: Payer: Self-pay | Admitting: Rheumatology

## 2017-11-23 NOTE — Telephone Encounter (Signed)
[name] from [facility] left a voicemail requesting a prescription refill of Methotrexate to be faxed to [phone]   If you have any questions, please call #[phone]

## 2017-11-26 MED ORDER — METHOTREXATE SODIUM CHEMO INJECTION 50 MG/2ML: 20.0000 mg | INTRAMUSCULAR | 0 refills | Status: DC

## 2017-11-26 NOTE — Telephone Encounter (Signed)
Last Visit: 09/04/17 Next Visit: 02/05/18 Labs: 07/18/17 stable   Patient advised he is due for labs. Patient will come this week to update labs.   Okay to refill 30 day supply per Dr. Corliss Skainseveshwar

## 2017-12-11 ENCOUNTER — Other Ambulatory Visit: Payer: Self-pay

## 2017-12-11 DIAGNOSIS — Z79899 Other long term (current) drug therapy: Secondary | ICD-10-CM

## 2017-12-12 LAB — COMPLETE METABOLIC PANEL WITH GFR
AG Ratio: 2 (calc) (ref 1.0–2.5)
ALBUMIN MSPROF: 3.9 g/dL (ref 3.6–5.1)
ALT: 14 U/L (ref 9–46)
AST: 14 U/L (ref 10–35)
Alkaline phosphatase (APISO): 63 U/L (ref 40–115)
BUN: 17 mg/dL (ref 7–25)
CO2: 30 mmol/L (ref 20–32)
CREATININE: 1 mg/dL (ref 0.70–1.25)
Calcium: 9 mg/dL (ref 8.6–10.3)
Chloride: 107 mmol/L (ref 98–110)
GFR, EST NON AFRICAN AMERICAN: 80 mL/min/{1.73_m2} (ref >=60)
GFR, Est African American: 92 mL/min/{1.73_m2} (ref >=60)
GLUCOSE: 111 mg/dL — AB (ref 65–99)
Globulin: 2 g/dL (calc) (ref 1.9–3.7)
Potassium: 4.4 mmol/L (ref 3.5–5.3)
SODIUM: 143 mmol/L (ref 135–146)
Total Bilirubin: 0.6 mg/dL (ref 0.2–1.2)
Total Protein: 5.9 g/dL — ABNORMAL LOW (ref 6.1–8.1)

## 2017-12-12 LAB — CBC WITH DIFFERENTIAL/PLATELET
BASOS ABS: 58 {cells}/uL (ref 0–200)
Basophils Relative: 1 %
Eosinophils Absolute: 87 cells/uL (ref 15–500)
Eosinophils Relative: 1.5 %
HEMATOCRIT: 36.7 % — AB (ref 38.5–50.0)
Hemoglobin: 12.3 g/dL — ABNORMAL LOW (ref 13.2–17.1)
LYMPHS ABS: 1485 {cells}/uL (ref 850–3900)
MCH: 32.4 pg (ref 27.0–33.0)
MCHC: 33.5 g/dL (ref 32.0–36.0)
MCV: 96.6 fL (ref 80.0–100.0)
MPV: 9.7 fL (ref 7.5–12.5)
Monocytes Relative: 11 %
NEUTROS PCT: 60.9 %
Neutro Abs: 3532 cells/uL (ref 1500–7800)
PLATELETS: 276 10*3/uL (ref 140–400)
RBC: 3.8 10*6/uL — ABNORMAL LOW (ref 4.20–5.80)
RDW: 14 % (ref 11.0–15.0)
TOTAL LYMPHOCYTE: 25.6 %
WBC mixed population: 638 cells/uL (ref 200–950)
WBC: 5.8 10*3/uL (ref 3.8–10.8)

## 2017-12-12 NOTE — Progress Notes (Signed)
Glucose is 111.  CBC revealed findings consistent with anemia.  Please advise patient to take a multivitamin with iron.  Please forward lab results to PCP.

## 2017-12-21 ENCOUNTER — Other Ambulatory Visit: Payer: Self-pay | Admitting: Rheumatology

## 2017-12-21 NOTE — Telephone Encounter (Signed)
Last visit: 09/04/2017 Next visit: 02/05/2018  Okay to refill per Dr. Deveshwar.  

## 2018-01-22 ENCOUNTER — Other Ambulatory Visit: Payer: Self-pay | Admitting: Rheumatology

## 2018-01-22 NOTE — Progress Notes (Signed)
Office Visit Note  Patient: Andrew MenDavid D Jocelyn             Date of Birth: [birth year]           MRN: [id number]             PCP: Gordan PaymentGrisso, Greg A., MD Referring: Gordan PaymentGrisso, Greg A., MD Visit Date: 02/05/2018 Occupation: @GUAROCC @  Subjective:  Pain in hands.   History of Present Illness: [name] is a 62 y.o. male history of seronegative inflammatory arthritis.  He has been on combination of methotrexate and sulfasalazine.  He states he has intermittent pain and discomfort in his hands.  He notices some swelling in his right second and third MCP joint.  Is not having any discomfort in his knees or feet.  He had no recurrence of olecranon bursitis.  Activities of Daily Living:  Patient reports morning stiffness for 5 minutes.   Patient Denies nocturnal pain.  Difficulty dressing/grooming: Denies Difficulty climbing stairs: Denies Difficulty getting out of chair: Denies Difficulty using hands for taps, buttons, cutlery, and/or writing: Denies  Review of Systems  Constitutional: Positive for fatigue. Negative for night sweats.  HENT: Negative for mouth sores, mouth dryness and nose dryness.   Eyes: Negative for redness and dryness.  Respiratory: Negative for shortness of breath and difficulty breathing.   Cardiovascular: Negative for chest pain, palpitations, hypertension, irregular heartbeat and swelling in legs/feet.  Gastrointestinal: Negative for constipation and diarrhea.  Endocrine: Negative for increased urination.  Musculoskeletal: Positive for arthralgias, joint pain and morning stiffness. Negative for joint swelling, myalgias, muscle weakness, muscle tenderness and myalgias.  Skin: Negative for color change, rash, hair loss, nodules/bumps, skin tightness, ulcers and sensitivity to sunlight.  Allergic/Immunologic: Negative for susceptible to infections.  Neurological: Negative for dizziness, fainting, memory loss, night sweats and weakness ( ).  Hematological: Negative for  swollen glands.  Psychiatric/Behavioral: Negative for depressed mood and sleep disturbance. The patient is not nervous/anxious.     PMFS History:  Patient Active Problem List   Diagnosis Date Noted  . Seronegative Inflammatory arthritis 01/15/2017    Past Medical History:  Diagnosis Date  . Arthritis   . Kidney stone     Family History  Problem Relation Age of Onset  . Cancer Mother        breast   Past Surgical History:  Procedure Laterality Date  . ANKLE ARTHROPLASTY Right    1989 right ankle reconstruction  . CARPAL TUNNEL RELEASE Bilateral   . JOINT REPLACEMENT     right knee partial   . JOINT REPLACEMENT     left knee partial  . KNEE ARTHROPLASTY    . KNEE ARTHROSCOPY    . TOTAL SHOULDER ARTHROPLASTY     Social History   Social History Narrative  . Not on file    Objective: Vital Signs: BP (!) 146/87 (BP Location: Left Arm, Patient Position: Sitting, Cuff Size: Normal)   Pulse 63   Resp 13   Ht 5\' 10"  (1.778 m)   Wt 172 lb 6.4 oz (78.2 kg)   BMI 24.74 kg/m    Physical Exam  Constitutional: He is oriented to person, place, and time. He appears well-developed and well-nourished.  HENT:  Head: Normocephalic and atraumatic.  Eyes: Pupils are equal, round, and reactive to light. Conjunctivae and EOM are normal.  Neck: Normal range of motion. Neck supple.  Cardiovascular: Normal rate, regular rhythm and normal heart sounds.  Pulmonary/Chest: Effort normal and breath sounds normal.  Abdominal: Soft. Bowel sounds are normal.  Neurological: He is alert and oriented to person, place, and time.  Skin: Skin is warm and dry. Capillary refill takes less than 2 seconds.  Psychiatric: He has a normal mood and affect. His behavior is normal.  Nursing note and vitals reviewed.    Musculoskeletal Exam: C-spine thoracic lumbar spine good range of motion.  Shoulder joints elbow joints wrist joints were in good range of motion.  He had synovial thickening over right  second and third MCP joints.  Hip joints were in good range of motion.  He has bilateral partial knee replacement without any warmth swelling or effusion.  Ankle joints MTPs PIPs were in good range of motion with no synovitis.  CDAI Exam: CDAI Score: 2.8  Patient Global Assessment: 5 (mm); Provider Global Assessment: 3 (mm) Swollen: 2 ; Tender: 0  Joint Exam      Right  Left  MCP 2  Swollen      MCP 3  Swollen         Investigation: No additional findings.  Imaging: No results found.  Recent Labs: Lab Results  Component Value Date   WBC 5.8 12/11/2017   HGB 12.3 (L) 12/11/2017   PLT 276 12/11/2017   NA 143 12/11/2017   K 4.4 12/11/2017   CL 107 12/11/2017   CO2 30 12/11/2017   GLUCOSE 111 (H) 12/11/2017   BUN 17 12/11/2017   CREATININE 1.00 12/11/2017   BILITOT 0.6 12/11/2017   AST 14 12/11/2017   ALT 14 12/11/2017   PROT 5.9 (L) 12/11/2017   CALCIUM 9.0 12/11/2017   GFRAA 92 12/11/2017   QFTBGOLDPLUS NEGATIVE 06/06/2017    Speciality Comments: No specialty comments available.  Procedures:  No procedures performed Allergies: Patient has no known allergies.   Assessment / Plan:     Visit Diagnoses: Seronegative Inflammatory arthritis-patient had no synovitis on examination he has some synovial thickening.  He has been experiencing some morning stiffness.  High risk medication use - MTX 0.8 ml sq q wk, folic acid 2 mg po qd, SSZ 2 tabs po bid.  His labs are stable.  We will continue to monitor his labs every 3 months.  Primary osteoarthritis of both hands-he has some osteoarthritic changes which causes discomfort.  History of partial knee replacement-bilateral.  He has some stiffness in his knee joints but no warmth swelling or effusion.  Anxiety and depression-he is on treatment.  History of kidney stones   Orders: No orders of the defined types were placed in this encounter.  No orders of the defined types were placed in this  encounter.   Face-to-face time spent with patient was 30 minutes. Greater than 50% of time was spent in counseling and coordination of care.  Follow-Up Instructions: Return in about 5 months (around 07/07/2018) for Rheumatoid arthritis.   [name], MD  Note - This record has been created using Animal nutritionist.  Chart creation errors have been sought, but may not always  have been located. Such creation errors do not reflect on  the standard of medical care.

## 2018-01-22 NOTE — Telephone Encounter (Signed)
Last Visit: 09/04/17 Next Visit: 02/05/18 Labs: 12/11/17 Glucose is 111. CBC revealed findings consistent with anemia.   Okay to refill per Dr. Corliss Skainseveshwar

## 2018-02-05 ENCOUNTER — Ambulatory Visit: Payer: BC Managed Care – PPO | Admitting: Rheumatology

## 2018-02-05 ENCOUNTER — Encounter: Payer: Self-pay | Admitting: Rheumatology

## 2018-02-05 VITALS — BP 146/87 | HR 63 | Resp 13 | Ht 70.0 in | Wt 172.4 lb

## 2018-02-05 DIAGNOSIS — Z96652 Presence of left artificial knee joint: Secondary | ICD-10-CM | POA: Insufficient documentation

## 2018-02-05 DIAGNOSIS — M1990 Unspecified osteoarthritis, unspecified site: Secondary | ICD-10-CM

## 2018-02-05 DIAGNOSIS — F32A Depression, unspecified: Secondary | ICD-10-CM | POA: Insufficient documentation

## 2018-02-05 DIAGNOSIS — M19041 Primary osteoarthritis, right hand: Secondary | ICD-10-CM | POA: Diagnosis not present

## 2018-02-05 DIAGNOSIS — Z79899 Other long term (current) drug therapy: Secondary | ICD-10-CM

## 2018-02-05 DIAGNOSIS — F329 Major depressive disorder, single episode, unspecified: Secondary | ICD-10-CM | POA: Insufficient documentation

## 2018-02-05 DIAGNOSIS — F419 Anxiety disorder, unspecified: Secondary | ICD-10-CM

## 2018-02-05 DIAGNOSIS — M19042 Primary osteoarthritis, left hand: Secondary | ICD-10-CM

## 2018-02-05 DIAGNOSIS — Z87442 Personal history of urinary calculi: Secondary | ICD-10-CM | POA: Insufficient documentation

## 2018-02-05 DIAGNOSIS — Z96659 Presence of unspecified artificial knee joint: Secondary | ICD-10-CM | POA: Diagnosis not present

## 2018-02-05 NOTE — Patient Instructions (Signed)
Standing Labs We placed an order today for your standing lab work.    Please come back and get your standing labs in November and every 3 months  We have open lab Monday through Friday from 8:30-11:30 AM and 1:30-4:00 PM  at the office of Dr. Nickia Boesen.   You may experience shorter wait times on Monday and Friday afternoons. The office is located at [address], [address], [place],  [address] No appointment is necessary.   Labs are drawn by [facility].  You may receive a bill from [facility] for your lab work. If you have any questions regarding directions or hours of operation,  please call [phone].   Just as a reminder please drink plenty of water prior to coming for your lab work. Thanks!  

## 2018-02-25 ENCOUNTER — Other Ambulatory Visit: Payer: Self-pay | Admitting: Rheumatology

## 2018-02-25 NOTE — Telephone Encounter (Signed)
Last Visit: 02/05/18 Next visit: 07/09/18 Labs: 12/11/17 Glucose is 111. CBC revealed findings consistent with anemia.  Okay to refill per Dr. Corliss Skains

## 2018-03-25 ENCOUNTER — Other Ambulatory Visit: Payer: Self-pay | Admitting: Rheumatology

## 2018-03-25 NOTE — Telephone Encounter (Signed)
Last Visit: 02/05/18 Next visit: 07/09/18  Okay to refill per Dr.Deveshwar 

## 2018-04-10 ENCOUNTER — Other Ambulatory Visit: Payer: Self-pay | Admitting: Physician Assistant

## 2018-04-10 DIAGNOSIS — Z79899 Other long term (current) drug therapy: Secondary | ICD-10-CM

## 2018-04-11 LAB — CBC WITH DIFFERENTIAL/PLATELET
Basophils Absolute: 31 cells/uL (ref 0–200)
Basophils Relative: 0.8 %
EOS ABS: 70 {cells}/uL (ref 15–500)
EOS PCT: 1.8 %
HCT: 38.3 % — ABNORMAL LOW (ref 38.5–50.0)
Hemoglobin: 13.1 g/dL — ABNORMAL LOW (ref 13.2–17.1)
Lymphs Abs: 991 cells/uL (ref 850–3900)
MCH: 33 pg (ref 27.0–33.0)
MCHC: 34.2 g/dL (ref 32.0–36.0)
MCV: 96.5 fL (ref 80.0–100.0)
MONOS PCT: 7.2 %
MPV: 9.9 fL (ref 7.5–12.5)
NEUTROS ABS: 2527 {cells}/uL (ref 1500–7800)
Neutrophils Relative %: 64.8 %
PLATELETS: 250 10*3/uL (ref 140–400)
RBC: 3.97 10*6/uL — ABNORMAL LOW (ref 4.20–5.80)
RDW: 14.2 % (ref 11.0–15.0)
TOTAL LYMPHOCYTE: 25.4 %
WBC mixed population: 281 cells/uL (ref 200–950)
WBC: 3.9 10*3/uL (ref 3.8–10.8)

## 2018-04-11 LAB — COMPLETE METABOLIC PANEL WITH GFR
AG RATIO: 2.1 (calc) (ref 1.0–2.5)
ALBUMIN MSPROF: 4.1 g/dL (ref 3.6–5.1)
ALT: 21 U/L (ref 9–46)
AST: 22 U/L (ref 10–35)
Alkaline phosphatase (APISO): 62 U/L (ref 40–115)
BUN: 18 mg/dL (ref 7–25)
CALCIUM: 9 mg/dL (ref 8.6–10.3)
CO2: 28 mmol/L (ref 20–32)
Chloride: 105 mmol/L (ref 98–110)
Creat: 1.04 mg/dL (ref 0.70–1.25)
GFR, EST AFRICAN AMERICAN: 88 mL/min/{1.73_m2} (ref >=60)
GFR, Est Non African American: 76 mL/min/{1.73_m2} (ref >=60)
GLOBULIN: 2 g/dL (ref 1.9–3.7)
Glucose, Bld: 101 mg/dL — ABNORMAL HIGH (ref 65–99)
POTASSIUM: 3.8 mmol/L (ref 3.5–5.3)
SODIUM: 141 mmol/L (ref 135–146)
TOTAL PROTEIN: 6.1 g/dL (ref 6.1–8.1)
Total Bilirubin: 1 mg/dL (ref 0.2–1.2)

## 2018-04-11 NOTE — Progress Notes (Signed)
Anemia is improving.  All other labs are WNL.

## 2018-04-26 ENCOUNTER — Other Ambulatory Visit: Payer: Self-pay | Admitting: Rheumatology

## 2018-04-26 NOTE — Telephone Encounter (Signed)
Last Visit: 02/05/18 Next visit: 07/09/18 Labs: 04/10/18 Anemia is improving. All other labs are WNL.  Okay to refill per Dr.Deveshwar

## 2018-06-25 NOTE — Progress Notes (Signed)
Office Visit Note  Patient: [name]             Date of Birth: 04/18/1956           MRN: [id number]             PCP: Gordan Payment., MD Referring: Gordan Payment., MD Visit Date: 07/09/2018 Occupation: @GUAROCC @  Subjective:  No chief complaint on file.    History of Present Illness: [name] is a 63 y.o. male with history of rheumatoid arthritis. Patient reports bilateral hand discomfort and left hand weakness after use. He denies rheumatoid arthritis flares or joint swelling. Reports bilateral ankle, knee and feet stiffness in the morning, which improves with movement. He denies other joint discomfort. Patient states he has noticed a nodule on his right thumb. He is on Methotrexate and Sulfasalazine.     Activities of Daily Living:  Patient reports morning stiffness for 30 minutes.   Patient Denies nocturnal pain.  Difficulty dressing/grooming: Reports Difficulty climbing stairs: Denies Difficulty getting out of chair: Denies Difficulty using hands for taps, buttons, cutlery, and/or writing: Denies  Review of Systems  Constitutional: Positive for fatigue. Negative for night sweats.  HENT: Negative for mouth sores, mouth dryness and nose dryness.   Eyes: Negative for redness, visual disturbance and dryness.  Respiratory: Negative for cough, hemoptysis, shortness of breath and difficulty breathing.   Cardiovascular: Negative for chest pain, palpitations, hypertension, irregular heartbeat and swelling in legs/feet.  Gastrointestinal: Negative for blood in stool, constipation and diarrhea.  Endocrine: Negative for increased urination.  Genitourinary: Negative for painful urination.  Musculoskeletal: Positive for arthralgias, joint pain, muscle weakness and morning stiffness. Negative for joint swelling, myalgias, muscle tenderness and myalgias.  Skin: Negative for color change, rash, hair loss, nodules/bumps, skin tightness, ulcers and sensitivity to sunlight.    Allergic/Immunologic: Negative for susceptible to infections.  Neurological: Negative for dizziness, fainting, memory loss, night sweats and weakness.  Hematological: Negative for swollen glands.  Psychiatric/Behavioral: Negative for depressed mood and sleep disturbance. The patient is not nervous/anxious.     PMFS History:  Patient Active Problem List   Diagnosis Date Noted  . Primary osteoarthritis of both hands 02/05/2018  . History of partial knee replacement 02/05/2018  . Anxiety and depression 02/05/2018  . History of kidney stones 02/05/2018  . High risk medication use 02/05/2018  . Seronegative Inflammatory arthritis 01/15/2017    Past Medical History:  Diagnosis Date  . Arthritis   . Kidney stone     Family History  Problem Relation Age of Onset  . Cancer Mother        breast   Past Surgical History:  Procedure Laterality Date  . ANKLE ARTHROPLASTY Right    1989 right ankle reconstruction  . CARPAL TUNNEL RELEASE Bilateral   . JOINT REPLACEMENT     right knee partial   . JOINT REPLACEMENT     left knee partial  . KNEE ARTHROPLASTY    . KNEE ARTHROSCOPY    . TOTAL SHOULDER ARTHROPLASTY     Social History   Social History Narrative  . Not on file    There is no immunization history on file for this patient.   Objective: Vital Signs: BP (!) 145/87 (BP Location: Left Arm, Patient Position: Sitting, Cuff Size: Normal)   Pulse 70   Resp 14   Ht 5\' 10"  (1.778 m)   Wt 176 lb 9.6 oz (80.1 kg)   BMI 25.34 kg/m  Physical Exam Vitals signs and nursing note reviewed.  Constitutional:      Appearance: He is well-developed.  HENT:     Head: Normocephalic and atraumatic.  Eyes:     Conjunctiva/sclera: Conjunctivae normal.     Pupils: Pupils are equal, round, and reactive to light.  Neck:     Musculoskeletal: Normal range of motion and neck supple.  Cardiovascular:     Rate and Rhythm: Normal rate and regular rhythm.     Heart sounds: Normal heart  sounds.  Pulmonary:     Effort: Pulmonary effort is normal.     Breath sounds: Normal breath sounds.  Abdominal:     General: Bowel sounds are normal.     Palpations: Abdomen is soft.  Lymphadenopathy:     Cervical: No cervical adenopathy.  Skin:    General: Skin is warm and dry.     Capillary Refill: Capillary refill takes less than 2 seconds.  Neurological:     Mental Status: He is alert and oriented to person, place, and time.  Psychiatric:        Behavior: Behavior normal.      Musculoskeletal Exam: C-spine, thoracic spine, and lumbar spine good range of motion. Shoulder joints and elbow joints in good range of motion. Limited wrist joints range of motion with synovial thickening of right 2nd and 3rd MCP joints.  No synovitis was noted.  Hip joints were in good range of motion.  He has bilateral partial knee replacement which appears to be doing well.  No synovitis was noted over MTPs or PIPs.  CDAI Exam: CDAI Score: Not documented Patient Global Assessment: Not documented; Provider Global Assessment: 1 (mm) Swollen: 0 ; Tender: 0  Joint Exam   Not documented   There is currently no information documented on the homunculus. Go to the Rheumatology activity and complete the homunculus joint exam.  Investigation: No additional findings.  Imaging: No results found.  Recent Labs: Lab Results  Component Value Date   WBC 3.9 04/10/2018   HGB 13.1 (L) 04/10/2018   PLT 250 04/10/2018   NA 141 04/10/2018   K 3.8 04/10/2018   CL 105 04/10/2018   CO2 28 04/10/2018   GLUCOSE 101 (H) 04/10/2018   BUN 18 04/10/2018   CREATININE 1.04 04/10/2018   BILITOT 1.0 04/10/2018   AST 22 04/10/2018   ALT 21 04/10/2018   PROT 6.1 04/10/2018   CALCIUM 9.0 04/10/2018   GFRAA 88 04/10/2018   QFTBGOLDPLUS NEGATIVE 06/06/2017    Speciality Comments: No specialty comments available.  Procedures:  No procedures performed Allergies: Patient has no known allergies.   Assessment /  Plan:     Visit Diagnoses: Seronegative Inflammatory arthritis-it is clinically doing well on methotrexate and sulfasalazine combination.  He had no synovitis on examination today.  High risk medication use - MTX 0.8 ml sq q wk, folic acid 2 mg po qd, SSZ 2 tabs po bid.  - Plan: CBC with Differential/Platelet, COMPLETE METABOLIC PANEL WITH GFR today and every 3 months  Primary osteoarthritis of both hands-he is some osteoarthritic changes in his hands.  No synovitis was noted.  He has  some synovial thickening.  History of partial knee replacement - Bilateral, doing well  Anxiety and depression  History of kidney stones   Orders: Orders Placed This Encounter  Procedures  . CBC with Differential/Platelet  . COMPLETE METABOLIC PANEL WITH GFR   No orders of the defined types were placed in this encounter.  Follow-Up Instructions: Return in about 5 months (around 12/09/2018) for Seronegative inflammatory arthritis , Osteoarthritis.   Pollyann SavoyShaili Banesa Tristan, MD  Note - This record has been created using Animal nutritionistDragon software.  Chart creation errors have been sought, but may not always  have been located. Such creation errors do not reflect on  the standard of medical care.

## 2018-07-08 ENCOUNTER — Other Ambulatory Visit: Payer: Self-pay | Admitting: Rheumatology

## 2018-07-08 NOTE — Telephone Encounter (Signed)
Last Visit: 02/05/18 Next visit: 07/09/18  Okay to refill per Dr.Deveshwar

## 2018-07-08 NOTE — Telephone Encounter (Signed)
Last Visit: 02/05/18 Next visit: 07/09/18 Labs: 04/10/18 Anemia is improving. All other labs are WNL.  Left message to remind patient he is due for labs.   Okay to refill per Dr.Deveshwar

## 2018-07-09 ENCOUNTER — Encounter: Payer: Self-pay | Admitting: Physician Assistant

## 2018-07-09 ENCOUNTER — Encounter (INDEPENDENT_AMBULATORY_CARE_PROVIDER_SITE_OTHER): Payer: Self-pay

## 2018-07-09 ENCOUNTER — Ambulatory Visit: Payer: BC Managed Care – PPO | Admitting: Rheumatology

## 2018-07-09 VITALS — BP 145/87 | HR 70 | Resp 14 | Ht 70.0 in | Wt 176.6 lb

## 2018-07-09 DIAGNOSIS — Z79899 Other long term (current) drug therapy: Secondary | ICD-10-CM

## 2018-07-09 DIAGNOSIS — M1990 Unspecified osteoarthritis, unspecified site: Secondary | ICD-10-CM

## 2018-07-09 DIAGNOSIS — M19042 Primary osteoarthritis, left hand: Secondary | ICD-10-CM

## 2018-07-09 DIAGNOSIS — Z96659 Presence of unspecified artificial knee joint: Secondary | ICD-10-CM | POA: Diagnosis not present

## 2018-07-09 DIAGNOSIS — F329 Major depressive disorder, single episode, unspecified: Secondary | ICD-10-CM

## 2018-07-09 DIAGNOSIS — M19041 Primary osteoarthritis, right hand: Secondary | ICD-10-CM | POA: Diagnosis not present

## 2018-07-09 DIAGNOSIS — Z87442 Personal history of urinary calculi: Secondary | ICD-10-CM

## 2018-07-09 DIAGNOSIS — F419 Anxiety disorder, unspecified: Secondary | ICD-10-CM

## 2018-07-09 NOTE — Patient Instructions (Addendum)
Standing Labs We placed an order today for your standing lab work.    Please come back and get your standing labs in June and every 3 months   We have open lab Monday through Friday from 8:30-11:30 AM and 1:30-4:00 PM  at the office of Dr. [name].   You may experience shorter wait times on Monday and Friday afternoons. The office is located at [address], [place], [place] [address] No appointment is necessary.   Labs are drawn by [facility].  You may receive a bill from [facility] for your lab work.  If you wish to have your labs drawn at another location, please call the office 24 hours in advance to send orders.  If you have any questions regarding directions or hours of operation,  please call [phone].   Just as a reminder please drink plenty of water prior to coming for your lab work. Thanks!   Hand Exercises Hand exercises can be helpful to almost anyone. These exercises can strengthen the hands, improve flexibility and movement, and increase blood flow to the hands. These results can make work and daily tasks easier. Hand exercises can be especially helpful for people who have joint pain from arthritis or have nerve damage from overuse (carpal tunnel syndrome). These exercises can also help people who have injured a hand. Most of these hand exercises are fairly gentle stretching routines. You can do them often throughout the day. Still, it is a good idea to ask your health care provider which exercises would be best for you. Warming your hands before exercise may help to reduce stiffness. You can do this with gentle massage or by placing your hands in warm water for 15 minutes. Also, make sure you pay attention to your level of hand pain as you begin an exercise routine. Exercises Knuckle bend Repeat this exercise 5-10 times with each hand. 1. Stand or sit with your arm, hand, and all five fingers pointed straight up. Make sure your wrist is straight. 2. Gently  and slowly bend your fingers down and inward until the tips of your fingers are touching the tops of your palm. 3. Hold this position for a few seconds. 4. Extend your fingers out to their original position, all pointing straight up again. Finger fan Repeat this exercise 5-10 times with each hand. 1. Hold your arm and hand out in front of you. Keep your wrist straight. 2. Squeeze your hand into a fist. 3. Hold this position for a few seconds. 4. Loel Dubonnet out, or spread apart, your hand and fingers as much as possible, stretching every joint fully. Tabletop Repeat this exercise 5-10 times with each hand. 1. Stand or sit with your arm, hand, and all five fingers pointed straight up. Make sure your wrist is straight. 2. Gently and slowly bend your fingers at the knuckles where they meet the hand until your hand is making an upside-down L shape. Your fingers should form a tabletop. 3. Hold this position for a few seconds. 4. Extend your fingers out to their original position, all pointing straight up again. Making Os Repeat this exercise 5-10 times with each hand. 1. Stand or sit with your arm, hand, and all five fingers pointed straight up. Make sure your wrist is straight. 2. Make an O shape by touching your pointer finger to your thumb. Hold for a few seconds. Then open your hand wide. 3. Repeat this motion with each finger on your hand. Table spread Repeat this  exercise 5-10 times with each hand. 1. Place your hand on a table with your palm facing down. Make sure your wrist is straight. 2. Spread your fingers out as much as possible. Hold this position for a few seconds. 3. Slide your fingers back together again. Hold for a few seconds. Ball grip Repeat this exercise 10-15 times with each hand. 1. Hold a tennis ball or another soft ball in your hand. 2. While slowly increasing pressure, squeeze the ball as hard as possible. 3. Squeeze as hard as you can for 3-5 seconds. 4. Relax and  repeat.  Wrist curls Repeat this exercise 10-15 times with each hand. 1. Sit in a chair that has armrests. 2. Hold a light weight in your hand, such as a dumbbell that weighs 1-3 pounds (0.5-1.4 kg). Ask your health care provider what weight would be best for you. 3. Rest your hand just over the end of the chair arm with your palm facing up. 4. Gently pivot your wrist up and down while holding the weight. Do not twist your wrist from side to side. Contact a health care provider if:  Your hand pain or discomfort gets much worse when you do an exercise.  Your hand pain or discomfort does not improve within 2 hours after you exercise. If you have any of these problems, stop doing these exercises right away. Do not do them again unless your health care provider says that you can. Get help right away if:  You develop sudden, severe hand pain. If this happens, stop doing these exercises right away. Do not do them again unless your health care provider says that you can. This information is not intended to replace advice given to you by your health care provider. Make sure you discuss any questions you have with your health care provider. Document Released: 04/05/2015 Document Revised: 08/28/2017 Document Reviewed: 11/02/2014 Elsevier Interactive Patient Education  2019 [employer].

## 2018-07-10 LAB — COMPLETE METABOLIC PANEL WITH GFR
AG RATIO: 2.8 (calc) — AB (ref 1.0–2.5)
ALT: 20 U/L (ref 9–46)
AST: 22 U/L (ref 10–35)
Albumin: 4.7 g/dL (ref 3.6–5.1)
Alkaline phosphatase (APISO): 60 U/L (ref 35–144)
BUN: 19 mg/dL (ref 7–25)
CO2: 28 mmol/L (ref 20–32)
Calcium: 9.3 mg/dL (ref 8.6–10.3)
Chloride: 104 mmol/L (ref 98–110)
Creat: 1.07 mg/dL (ref 0.70–1.25)
GFR, Est African American: 85 mL/min/{1.73_m2} (ref >=60)
GFR, Est Non African American: 73 mL/min/{1.73_m2} (ref >=60)
Globulin: 1.7 g/dL (calc) — ABNORMAL LOW (ref 1.9–3.7)
Glucose, Bld: 97 mg/dL (ref 65–99)
POTASSIUM: 4.2 mmol/L (ref 3.5–5.3)
SODIUM: 142 mmol/L (ref 135–146)
Total Bilirubin: 1 mg/dL (ref 0.2–1.2)
Total Protein: 6.4 g/dL (ref 6.1–8.1)

## 2018-07-10 LAB — CBC WITH DIFFERENTIAL/PLATELET
ABSOLUTE MONOCYTES: 422 {cells}/uL (ref 200–950)
Basophils Absolute: 41 cells/uL (ref 0–200)
Basophils Relative: 1.1 %
Eosinophils Absolute: 59 cells/uL (ref 15–500)
Eosinophils Relative: 1.6 %
HCT: 42.1 % (ref 38.5–50.0)
Hemoglobin: 14.3 g/dL (ref 13.2–17.1)
Lymphs Abs: 844 cells/uL — ABNORMAL LOW (ref 850–3900)
MCH: 33.1 pg — ABNORMAL HIGH (ref 27.0–33.0)
MCHC: 34 g/dL (ref 32.0–36.0)
MCV: 97.5 fL (ref 80.0–100.0)
MPV: 10.1 fL (ref 7.5–12.5)
Monocytes Relative: 11.4 %
Neutro Abs: 2335 cells/uL (ref 1500–7800)
Neutrophils Relative %: 63.1 %
Platelets: 244 10*3/uL (ref 140–400)
RBC: 4.32 10*6/uL (ref 4.20–5.80)
RDW: 13.2 % (ref 11.0–15.0)
Total Lymphocyte: 22.8 %
WBC: 3.7 10*3/uL — AB (ref 3.8–10.8)

## 2018-07-10 NOTE — Progress Notes (Signed)
Mild neutropenia noted.  Patient was clinically doing well.  He may decrease sulfasalazine to 1 tablet p.o. twice daily.  We will check labs in 3 months again.

## 2018-07-12 ENCOUNTER — Telehealth: Payer: Self-pay | Admitting: *Deleted

## 2018-07-12 MED ORDER — SULFASALAZINE 500 MG PO TBEC: 500.0000 mg | DELAYED_RELEASE_TABLET | Freq: Two times a day (BID) | ORAL | 0 refills | Status: DC

## 2018-07-12 NOTE — Telephone Encounter (Signed)
-----   Message from [name], MD sent at 07/10/2018  1:10 PM EST ----- Mild neutropenia noted.  Patient was clinically doing well.  He may decrease sulfasalazine to 1 tablet p.o. twice daily.  We will check labs in 3 months again.

## 2018-07-23 DIAGNOSIS — K219 Gastro-esophageal reflux disease without esophagitis: Secondary | ICD-10-CM | POA: Insufficient documentation

## 2018-09-16 ENCOUNTER — Other Ambulatory Visit: Payer: Self-pay | Admitting: Rheumatology

## 2018-09-16 ENCOUNTER — Other Ambulatory Visit: Payer: Self-pay | Admitting: Physician Assistant

## 2018-09-16 NOTE — Telephone Encounter (Signed)
Last Visit: 07/09/18 Next visit: 12/09/18 Labs: 07/09/18 Mild neutropenia   Okay to refill per Dr. Deveshwar  

## 2018-09-16 NOTE — Telephone Encounter (Signed)
Last Visit: 07/09/18 Next visit: 12/09/18 Labs: 07/09/18 Mild neutropenia   Okay to refill per Dr. Corliss Skains

## 2018-10-15 ENCOUNTER — Other Ambulatory Visit: Payer: Self-pay | Admitting: Rheumatology

## 2018-10-15 NOTE — Telephone Encounter (Signed)
Last Visit: 07/09/2018 Next Visit: 12/09/2018  Okay to refill per Dr. Deveshwar.  

## 2018-10-29 ENCOUNTER — Other Ambulatory Visit: Payer: Self-pay

## 2018-10-29 DIAGNOSIS — Z79899 Other long term (current) drug therapy: Secondary | ICD-10-CM

## 2018-10-30 LAB — CBC WITH DIFFERENTIAL/PLATELET
Absolute Monocytes: 557 cells/uL (ref 200–950)
Basophils Absolute: 69 cells/uL (ref 0–200)
Basophils Relative: 1.5 %
Eosinophils Absolute: 230 cells/uL (ref 15–500)
Eosinophils Relative: 5 %
HCT: 39.7 % (ref 38.5–50.0)
Hemoglobin: 13.5 g/dL (ref 13.2–17.1)
Lymphs Abs: 1270 cells/uL (ref 850–3900)
MCH: 33.2 pg — ABNORMAL HIGH (ref 27.0–33.0)
MCHC: 34 g/dL (ref 32.0–36.0)
MCV: 97.5 fL (ref 80.0–100.0)
MPV: 10.1 fL (ref 7.5–12.5)
Monocytes Relative: 12.1 %
Neutro Abs: 2475 cells/uL (ref 1500–7800)
Neutrophils Relative %: 53.8 %
Platelets: 244 10*3/uL (ref 140–400)
RBC: 4.07 10*6/uL — ABNORMAL LOW (ref 4.20–5.80)
RDW: 14.5 % (ref 11.0–15.0)
Total Lymphocyte: 27.6 %
WBC: 4.6 10*3/uL (ref 3.8–10.8)

## 2018-10-30 LAB — COMPLETE METABOLIC PANEL WITH GFR
AG Ratio: 2.4 (calc) (ref 1.0–2.5)
ALT: 20 U/L (ref 9–46)
AST: 18 U/L (ref 10–35)
Albumin: 4.3 g/dL (ref 3.6–5.1)
Alkaline phosphatase (APISO): 58 U/L (ref 35–144)
BUN: 16 mg/dL (ref 7–25)
CO2: 30 mmol/L (ref 20–32)
Calcium: 9.1 mg/dL (ref 8.6–10.3)
Chloride: 105 mmol/L (ref 98–110)
Creat: 1.12 mg/dL (ref 0.70–1.25)
GFR, Est African American: 80 mL/min/{1.73_m2} (ref >=60)
GFR, Est Non African American: 69 mL/min/{1.73_m2} (ref >=60)
Globulin: 1.8 g/dL (calc) — ABNORMAL LOW (ref 1.9–3.7)
Glucose, Bld: 100 mg/dL — ABNORMAL HIGH (ref 65–99)
Potassium: 4.3 mmol/L (ref 3.5–5.3)
Sodium: 142 mmol/L (ref 135–146)
Total Bilirubin: 0.7 mg/dL (ref 0.2–1.2)
Total Protein: 6.1 g/dL (ref 6.1–8.1)

## 2018-10-30 NOTE — Progress Notes (Signed)
Labs are stable.

## 2018-11-26 NOTE — Progress Notes (Signed)
Office Visit Note  Patient: [name]             Date of Birth: 1955-08-23           MRN: [id number]             PCP: Gordan PaymentGrisso, Greg A., MD Referring: Gordan PaymentGrisso, Greg A., MD Visit Date: 12/09/2018 Occupation: @GUAROCC @  Subjective:  Pain in both hands     History of Present Illness: [name] is a 63 y.o. male with history of seronegative inflammatory arthritis and osteoarthritis.  He is on MTX 0.8 ml sq once weekly, folic acid 2mg  po daily, and Sulfasalazine 500 mg 1 tablet BID. He experiences pain in both hands intermittently.  He states if he hits his hand on something he will experience severe pain for 2-3 minutes.  He denies any joint swelling.  He experiences left knee joint pain, especially on the lateral aspect.  He is planning on following up with Dr. Lequita HaltAluisio who performed the partial knee replacement.  He has not been able to bike as frequently recently.  He continues to take Celebrex 200 mg 1 capsule by mouth every other day.    Activities of Daily Living:  Patient reports morning stiffness for 0 none.   Patient Denies nocturnal pain.  Difficulty dressing/grooming: Denies Difficulty climbing stairs: Denies Difficulty getting out of chair: Denies Difficulty using hands for taps, buttons, cutlery, and/or writing: Denies  Review of Systems  Constitutional: Negative for fatigue and night sweats.  HENT: Negative for mouth sores, mouth dryness and nose dryness.   Eyes: Negative for pain, redness, visual disturbance and dryness.  Respiratory: Negative for cough, hemoptysis, shortness of breath and difficulty breathing.   Cardiovascular: Negative for chest pain, palpitations, hypertension, irregular heartbeat and swelling in legs/feet.  Gastrointestinal: Negative for blood in stool, constipation and diarrhea.  Endocrine: Negative for increased urination.  Genitourinary: Negative for painful urination.  Musculoskeletal: Positive for arthralgias and joint pain. Negative  for joint swelling, myalgias, muscle weakness, morning stiffness, muscle tenderness and myalgias.  Skin: Negative for color change, rash, hair loss, nodules/bumps, skin tightness, ulcers and sensitivity to sunlight.  Allergic/Immunologic: Negative for susceptible to infections.  Neurological: Negative for dizziness, fainting, memory loss, night sweats and weakness.  Hematological: Negative for bruising/bleeding tendency and swollen glands.  Psychiatric/Behavioral: Positive for sleep disturbance. Negative for depressed mood. The patient is not nervous/anxious.     PMFS History:  Patient Active Problem List   Diagnosis Date Noted   Primary osteoarthritis of both hands 02/05/2018   History of partial knee replacement 02/05/2018   Anxiety and depression 02/05/2018   History of kidney stones 02/05/2018   High risk medication use 02/05/2018   Seronegative Inflammatory arthritis 01/15/2017    Past Medical History:  Diagnosis Date   Arthritis    Kidney stone     Family History  Problem Relation Age of Onset   Cancer Mother        breast   Past Surgical History:  Procedure Laterality Date   ANKLE ARTHROPLASTY Right    1989 right ankle reconstruction   CARPAL TUNNEL RELEASE Bilateral    JOINT REPLACEMENT     right knee partial    JOINT REPLACEMENT     left knee partial   KNEE ARTHROPLASTY     KNEE ARTHROSCOPY     TOTAL SHOULDER ARTHROPLASTY     Social History   Social History Narrative   Not on file   Immunization History  Administered Date(s) Administered   Pneumococcal Conjugate-13 08/28/2016   Pneumococcal Polysaccharide-23 02/26/2017   Td 05/08/2005   Zoster Recombinat (Shingrix) 08/02/2017, 11/30/2017     Objective: Vital Signs: BP (!) 150/81 (BP Location: Left Arm, Patient Position: Sitting, Cuff Size: Normal)    Pulse 76    Ht 5\' 10"  (1.778 m)    Wt 180 lb (81.6 kg)    BMI 25.83 kg/m    Physical Exam Vitals signs and nursing note reviewed.    Constitutional:      Appearance: He is well-developed.  HENT:     Head: Normocephalic and atraumatic.  Eyes:     Conjunctiva/sclera: Conjunctivae normal.     Pupils: Pupils are equal, round, and reactive to light.  Neck:     Musculoskeletal: Normal range of motion and neck supple.  Cardiovascular:     Rate and Rhythm: Normal rate and regular rhythm.     Heart sounds: Normal heart sounds.  Pulmonary:     Effort: Pulmonary effort is normal.     Breath sounds: Normal breath sounds.  Abdominal:     General: Bowel sounds are normal.     Palpations: Abdomen is soft.  Skin:    General: Skin is warm and dry.     Capillary Refill: Capillary refill takes less than 2 seconds.  Neurological:     Mental Status: He is alert and oriented to person, place, and time.  Psychiatric:        Behavior: Behavior normal.      Musculoskeletal Exam: C-spine, thoracic spine, and lumbar spine good ROM.  No midline spinal tenderness.  No SI joint tenderness.  Shoulder joints, elbow joints, wrist joints, MCPs, PIPs, and DIPs good ROM with no synovitis.  PIP and DIP synovial thickening consistent with osteoarthritis of both hands. Hip joints good ROM with no discomfort. Left knee partial replacement has small effusion and warmth. Right knee partial replacement doing well.   No tenderness or swelling of ankle joints.    CDAI Exam: CDAI Score: -- Patient Global: --; Provider Global: -- Swollen: --; Tender: -- Joint Exam   No joint exam has been documented for this visit   There is currently no information documented on the homunculus. Go to the Rheumatology activity and complete the homunculus joint exam.  Investigation: No additional findings.  Imaging: No results found.  Recent Labs: Lab Results  Component Value Date   WBC 4.6 10/29/2018   HGB 13.5 10/29/2018   PLT 244 10/29/2018   NA 142 10/29/2018   K 4.3 10/29/2018   CL 105 10/29/2018   CO2 30 10/29/2018   GLUCOSE 100 (H) 10/29/2018    BUN 16 10/29/2018   CREATININE 1.12 10/29/2018   BILITOT 0.7 10/29/2018   AST 18 10/29/2018   ALT 20 10/29/2018   PROT 6.1 10/29/2018   CALCIUM 9.1 10/29/2018   GFRAA 80 10/29/2018   QFTBGOLDPLUS NEGATIVE 06/06/2017    Speciality Comments: No specialty comments available.  Procedures:  No procedures performed Allergies: Nicotine   Assessment / Plan:     Visit Diagnoses: Seronegative Inflammatory arthritis - He has no synovitis on exam.  He is clinically doing well on methotrexate 0.8 mL subcutaneous injections once weekly, folic acid 2 mg by mouth daily, and sulfasalazine 500 mg 1 tablet twice daily.  He has not experienced any increased joint pain or joint swelling since reducing the dose of sulfasalazine due to low white blood cell count.  His white blood cell count was within  normal limits on 10/29/2018.  He has not had any recent flares.  He has intermittent pain in bilateral hands but no joint swelling was noted.  He takes Celebrex 200 mg 1 capsule by mouth every other day for pain relief.  He will continue on this current treatment regimen.  We will continue to monitor his lab work closely.  He is advised to notify us if he develops increased joint pain or joint swelling.  He will follow-up in the office in 5 months.  High risk medication use -Methotrexate 0.8 mL every 7 days, sulfasalazine 500 mg 1 tablets twice daily (he is on the reduced dose of sulfasalazine due to low white blood cell count in the past.  His white blood cell count has returned to normal.), and folic acid 1 mg 2 tablets daily.  Most recent CBC/CMP stable on 10/29/2018.  Due for repeat CBC/CMP in September and will monitor every 3 months.  Standing orders are in place.  He is up-to-date with pneumonia and shingles vaccines.  Primary osteoarthritis of both hands - He has intermittent pain in bilateral hands.  He has synovial thickening of bilateral PIP and DIP joints.  He has no synovitis on exam.  He has complete fist  formation bilaterally.  Joint protection and muscle strengthening were discussed.  History of partial knee replacement - bilateral.  -Doing well.  Performed by Dr. Lequita HaltAluisio.  He has been experiencing some discomfort on the lateral aspect of the left knee.  He has a small effusion of the left knee joint with mild warmth.  Right knee has good range of motion no warmth or effusion.  He is planning on following up with Dr. Antony OdeaLucio for further evaluation.  Other medical conditions are listed as follows:  History of kidney stones   Anxiety and depression   Orders: No orders of the defined types were placed in this encounter.  No orders of the defined types were placed in this encounter.    Follow-Up Instructions: Return in about 5 months (around 05/11/2019) for Rheumatoid arthritis, Osteoarthritis.   Gearldine Bienenstockaylor M Lavoy Bernards, PA-C   I examined and evaluated the patient with Sherron Alesaylor Amahia Madonia PA.  He continues to have some mild effusion on my palpation.  None of the other joints are inflamed.  The plan of care was discussed as noted above.  Pollyann SavoyShaili Deveshwar, MD  Note - This record has been created using Animal nutritionistDragon software.  Chart creation errors have been sought, but may not always  have been located. Such creation errors do not reflect on  the standard of medical care.

## 2018-11-28 ENCOUNTER — Other Ambulatory Visit: Payer: Self-pay | Admitting: Rheumatology

## 2018-11-28 NOTE — Telephone Encounter (Signed)
Last Visit: 07/09/2018 Next Visit: 12/09/2018 Labs: 10/29/18 stable   Okay to refill per Dr. Estanislado Pandy.

## 2018-12-09 ENCOUNTER — Ambulatory Visit: Payer: BC Managed Care – PPO | Admitting: Rheumatology

## 2018-12-09 ENCOUNTER — Encounter: Payer: Self-pay | Admitting: Rheumatology

## 2018-12-09 ENCOUNTER — Other Ambulatory Visit: Payer: Self-pay

## 2018-12-09 VITALS — BP 150/81 | HR 76 | Ht 70.0 in | Wt 180.0 lb

## 2018-12-09 DIAGNOSIS — Z79899 Other long term (current) drug therapy: Secondary | ICD-10-CM | POA: Diagnosis not present

## 2018-12-09 DIAGNOSIS — M19042 Primary osteoarthritis, left hand: Secondary | ICD-10-CM

## 2018-12-09 DIAGNOSIS — M1990 Unspecified osteoarthritis, unspecified site: Secondary | ICD-10-CM

## 2018-12-09 DIAGNOSIS — F419 Anxiety disorder, unspecified: Secondary | ICD-10-CM

## 2018-12-09 DIAGNOSIS — Z87442 Personal history of urinary calculi: Secondary | ICD-10-CM

## 2018-12-09 DIAGNOSIS — Z96659 Presence of unspecified artificial knee joint: Secondary | ICD-10-CM | POA: Diagnosis not present

## 2018-12-09 DIAGNOSIS — F32A Depression, unspecified: Secondary | ICD-10-CM

## 2018-12-09 DIAGNOSIS — M19041 Primary osteoarthritis, right hand: Secondary | ICD-10-CM

## 2018-12-09 DIAGNOSIS — F329 Major depressive disorder, single episode, unspecified: Secondary | ICD-10-CM

## 2018-12-09 NOTE — Patient Instructions (Signed)
Standing Labs We placed an order today for your standing lab work.    Please come back and get your standing labs in September and every 3 months  We have open lab daily Monday through Thursday from 8:30-12:30 PM and 1:30-4:30 PM and Friday from 8:30-12:30 PM and 1:30 -4:00 PM at the office of Dr. Shaili Deveshwar.   You may experience shorter wait times on Monday and Friday afternoons. The office is located at [address], [address], [place], [place] [address] No appointment is necessary.   Labs are drawn by [facility].  You may receive a bill from [facility] for your lab work.  If you wish to have your labs drawn at another location, please call the office 24 hours in advance to send orders.  If you have any questions regarding directions or hours of operation,  please call [phone].   Just as a reminder please drink plenty of water prior to coming for your lab work. Thanks!  

## 2019-01-27 ENCOUNTER — Other Ambulatory Visit: Payer: Self-pay | Admitting: Rheumatology

## 2019-01-27 NOTE — Telephone Encounter (Signed)
Last Visit: 12/09/18 Next Visit: 05/12/19  Okay to refill per Dr. Estanislado Pandy

## 2019-02-03 ENCOUNTER — Other Ambulatory Visit: Payer: Self-pay | Admitting: Rheumatology

## 2019-02-04 NOTE — Telephone Encounter (Signed)
Last Visit: 12/09/18 Next visit: 05/12/19 Labs: 10/29/18 stable  Patient advised he is due to update labs and will update this week.  Okay to refill 30 day supply per Dr. Estanislado Pandy

## 2019-03-04 ENCOUNTER — Other Ambulatory Visit: Payer: Self-pay | Admitting: Rheumatology

## 2019-03-04 ENCOUNTER — Other Ambulatory Visit: Payer: Self-pay

## 2019-03-04 DIAGNOSIS — Z79899 Other long term (current) drug therapy: Secondary | ICD-10-CM

## 2019-03-04 NOTE — Telephone Encounter (Addendum)
Last Visit: 12/09/18 Next Visit: 05/12/19 Labs: 10/29/18 stable  Patient advised he is due to update labs. Patient states he will come today.   Okay to refill per Dr. Estanislado Pandy

## 2019-03-05 LAB — CBC WITH DIFFERENTIAL/PLATELET
Absolute Monocytes: 596 cells/uL (ref 200–950)
Basophils Absolute: 53 cells/uL (ref 0–200)
Basophils Relative: 0.9 %
Eosinophils Absolute: 201 cells/uL (ref 15–500)
Eosinophils Relative: 3.4 %
HCT: 42.3 % (ref 38.5–50.0)
Hemoglobin: 14.1 g/dL (ref 13.2–17.1)
Lymphs Abs: 1387 cells/uL (ref 850–3900)
MCH: 32 pg (ref 27.0–33.0)
MCHC: 33.3 g/dL (ref 32.0–36.0)
MCV: 95.9 fL (ref 80.0–100.0)
MPV: 10.2 fL (ref 7.5–12.5)
Monocytes Relative: 10.1 %
Neutro Abs: 3664 cells/uL (ref 1500–7800)
Neutrophils Relative %: 62.1 %
Platelets: 289 10*3/uL (ref 140–400)
RBC: 4.41 10*6/uL (ref 4.20–5.80)
RDW: 13 % (ref 11.0–15.0)
Total Lymphocyte: 23.5 %
WBC: 5.9 10*3/uL (ref 3.8–10.8)

## 2019-03-05 LAB — COMPLETE METABOLIC PANEL WITH GFR
AG Ratio: 1.9 (calc) (ref 1.0–2.5)
ALT: 14 U/L (ref 9–46)
AST: 14 U/L (ref 10–35)
Albumin: 3.9 g/dL (ref 3.6–5.1)
Alkaline phosphatase (APISO): 60 U/L (ref 35–144)
BUN: 14 mg/dL (ref 7–25)
CO2: 31 mmol/L (ref 20–32)
Calcium: 9.4 mg/dL (ref 8.6–10.3)
Chloride: 105 mmol/L (ref 98–110)
Creat: 1.01 mg/dL (ref 0.70–1.25)
GFR, Est African American: 91 mL/min/{1.73_m2} (ref >=60)
GFR, Est Non African American: 78 mL/min/{1.73_m2} (ref >=60)
Globulin: 2.1 g/dL (calc) (ref 1.9–3.7)
Glucose, Bld: 92 mg/dL (ref 65–99)
Potassium: 4 mmol/L (ref 3.5–5.3)
Sodium: 143 mmol/L (ref 135–146)
Total Bilirubin: 0.5 mg/dL (ref 0.2–1.2)
Total Protein: 6 g/dL — ABNORMAL LOW (ref 6.1–8.1)

## 2019-03-13 ENCOUNTER — Encounter: Payer: Self-pay | Admitting: *Deleted

## 2019-04-01 ENCOUNTER — Other Ambulatory Visit: Payer: Self-pay | Admitting: Rheumatology

## 2019-04-01 NOTE — Telephone Encounter (Signed)
Last Visit: 12/09/2018 Next Visit: 05/12/2019 Labs: 03/04/2019 Total protein is borderline low-6.0. Rest of CMP WNL. CBC WNL.  Okay to refill per Dr. Estanislado Pandy.

## 2019-04-28 ENCOUNTER — Other Ambulatory Visit: Payer: Self-pay | Admitting: Rheumatology

## 2019-04-28 NOTE — Telephone Encounter (Signed)
Last Visit: 12/09/2018 Next Visit: 05/12/2019  Okay to refill per Dr. Estanislado Pandy.

## 2019-05-12 ENCOUNTER — Encounter: Payer: Self-pay | Admitting: Rheumatology

## 2019-05-12 ENCOUNTER — Other Ambulatory Visit: Payer: Self-pay

## 2019-05-12 ENCOUNTER — Telehealth (INDEPENDENT_AMBULATORY_CARE_PROVIDER_SITE_OTHER): Payer: BC Managed Care – PPO | Admitting: Rheumatology

## 2019-05-12 DIAGNOSIS — Z79899 Other long term (current) drug therapy: Secondary | ICD-10-CM

## 2019-05-12 DIAGNOSIS — M1990 Unspecified osteoarthritis, unspecified site: Secondary | ICD-10-CM

## 2019-05-12 DIAGNOSIS — Z96659 Presence of unspecified artificial knee joint: Secondary | ICD-10-CM

## 2019-05-12 DIAGNOSIS — M19041 Primary osteoarthritis, right hand: Secondary | ICD-10-CM

## 2019-05-12 DIAGNOSIS — M19042 Primary osteoarthritis, left hand: Secondary | ICD-10-CM

## 2019-05-12 DIAGNOSIS — F329 Major depressive disorder, single episode, unspecified: Secondary | ICD-10-CM

## 2019-05-12 DIAGNOSIS — F419 Anxiety disorder, unspecified: Secondary | ICD-10-CM

## 2019-05-12 DIAGNOSIS — Z87442 Personal history of urinary calculi: Secondary | ICD-10-CM

## 2019-05-12 NOTE — Progress Notes (Signed)
Virtual Visit via Video Note  I connected with [name] on 05/12/19 at  9:15 AM EST by a video enabled telemedicine application and verified that I am speaking with the correct person using two identifiers.  Location: Patient: Home Provider: Clinic  This service was conducted via virtual visit.  He was unable to use his webcam.  The patient was located at home. I was located in my office.  Consent was obtained prior to the virtual visit and is aware of possible charges through their insurance for this visit.  The patient is an established patient.  Dr. Estanislado Pandy, MD conducted the virtual visit and [name], PA-C acted as scribe during the service.  Office staff helped with scheduling follow up visits after the service was conducted.     I discussed the limitations of evaluation and management by telemedicine and the availability of in person appointments. The patient expressed understanding and agreed to proceed.  CC: Right hand pain History of Present Illness: Patient is a 64 year old male with a past medical history of seronegative inflammatory arthritis and osteoarthritis.  He is on MTX 0.8 ml sq once weekly, folic acid 2 mg po daily, and sulfasalazine 500 mg 1 tablet twice daily.  He has been experiencing an aching sensation between the right 3rd and 4th MCP joints for the past 10 days.  He states the discomfort is most prominent when making a fist.  He denies any overuse activities but he does remain active.  He denies any other joint pain or joint swelling.   Review of Systems  Constitutional: Negative for fever and malaise/fatigue.  Eyes: Negative for photophobia, pain, discharge and redness.  Respiratory: Negative for cough, shortness of breath and wheezing.   Cardiovascular: Negative for chest pain and palpitations.  Gastrointestinal: Negative for blood in stool, constipation and diarrhea.  Genitourinary: Negative for dysuria.  Musculoskeletal: Positive for joint pain.  Negative for back pain, myalgias and neck pain.  Skin: Negative for rash.  Neurological: Negative for dizziness and headaches.  Psychiatric/Behavioral: Negative for depression. The patient is not nervous/anxious and does not have insomnia.       Observations/Objective: Physical Exam  Constitutional: He is oriented to person, place, and time and well-developed, well-nourished, and in no distress.  HENT:  Head: Normocephalic and atraumatic.  Eyes: Conjunctivae are normal.  Pulmonary/Chest: Effort normal.  Neurological: He is alert and oriented to person, place, and time.  Psychiatric: Mood, memory, affect and judgment normal.   Patient reports morning stiffness for 5  minutes.   Patient denies nocturnal pain.  Difficulty dressing/grooming: Denies Difficulty climbing stairs: Denies Difficulty getting out of chair: Denies Difficulty using hands for taps, buttons, cutlery, and/or writing: Denies   Assessment and Plan: Visit Diagnoses: Seronegative Inflammatory arthritis -He is having tenderness between in the right 3rd and 4th MCP joints, which started 10 days ago.  He has not noticed joint inflammation.  He has not performed any overuse activities recently and is not having any difficulty with ADLs.  He is on MTX 0.8 ml sq once weekly, folic acid 2 mg po daily, and sulfasalazine 500 mg 1 tablet twice daily.  He has not missed any doses recently.  He will continue on this current regimen.  He was advised to notify us if his joint pain does not resolve and we will schedule a sooner follow up visit.  He will follow up in 3 months.   High risk medication use -Methotrexate 0.8 mL every 7  days, folic acid 1 mg 2 tablets daily, and sulfasalazine 500 mg 1 tablets twice daily (he is on the reduced dose of sulfasalazine due to low white blood cell count in the past.  His white blood cell count has returned to normal.) CBC and CMP were drawn on 03/04/19.  He is due to update lab work at the end of  January 2021.  Standing orders are in place.   Primary osteoarthritis of both hands: He is having discomfort between the right 3rd and 4th MCP joints for the past 10 days.  He has no noticed any obvious inflammation.  He has discomfort when making a complete fist. Joint protection and muscle strengthening were discussed.   History of partial knee replacement - bilateral.  -Doing well.  Performed by Dr. Lequita Halt.  He has no discomfort at this time.   Other medical conditions are listed as follows:  History of kidney stones   Anxiety and depression   Follow Up Instructions: He will follow up in 3 months.   I discussed the assessment and treatment plan with the patient. The patient was provided an opportunity to ask questions and all were answered. The patient agreed with the plan and demonstrated an understanding of the instructions.   The patient was advised to call back or seek an in-person evaluation if the symptoms worsen or if the condition fails to improve as anticipated.  I provided 15 minutes of non-face-to-face time during this encounter.  [name], MD  Scribed by-  Sherron Ales PA-C

## 2019-06-05 ENCOUNTER — Other Ambulatory Visit: Payer: Self-pay | Admitting: *Deleted

## 2019-06-05 ENCOUNTER — Other Ambulatory Visit: Payer: Self-pay | Admitting: Rheumatology

## 2019-06-05 DIAGNOSIS — Z79899 Other long term (current) drug therapy: Secondary | ICD-10-CM

## 2019-06-05 LAB — CBC WITH DIFFERENTIAL/PLATELET
Absolute Monocytes: 425 cells/uL (ref 200–950)
Basophils Absolute: 39 cells/uL (ref 0–200)
Basophils Relative: 1 %
Eosinophils Absolute: 90 cells/uL (ref 15–500)
Eosinophils Relative: 2.3 %
HCT: 40.5 % (ref 38.5–50.0)
Hemoglobin: 13.7 g/dL (ref 13.2–17.1)
Lymphs Abs: 920 cells/uL (ref 850–3900)
MCH: 32.6 pg (ref 27.0–33.0)
MCHC: 33.8 g/dL (ref 32.0–36.0)
MCV: 96.4 fL (ref 80.0–100.0)
MPV: 10.5 fL (ref 7.5–12.5)
Monocytes Relative: 10.9 %
Neutro Abs: 2426 cells/uL (ref 1500–7800)
Neutrophils Relative %: 62.2 %
Platelets: 220 10*3/uL (ref 140–400)
RBC: 4.2 10*6/uL (ref 4.20–5.80)
RDW: 13.7 % (ref 11.0–15.0)
Total Lymphocyte: 23.6 %
WBC: 3.9 10*3/uL (ref 3.8–10.8)

## 2019-06-05 LAB — COMPLETE METABOLIC PANEL WITH GFR
AG Ratio: 2.2 (calc) (ref 1.0–2.5)
ALT: 18 U/L (ref 9–46)
AST: 18 U/L (ref 10–35)
Albumin: 4.3 g/dL (ref 3.6–5.1)
Alkaline phosphatase (APISO): 63 U/L (ref 35–144)
BUN: 16 mg/dL (ref 7–25)
CO2: 31 mmol/L (ref 20–32)
Calcium: 9.1 mg/dL (ref 8.6–10.3)
Chloride: 105 mmol/L (ref 98–110)
Creat: 1.24 mg/dL (ref 0.70–1.25)
GFR, Est African American: 71 mL/min/{1.73_m2} (ref >=60)
GFR, Est Non African American: 61 mL/min/{1.73_m2} (ref >=60)
Globulin: 2 g/dL (calc) (ref 1.9–3.7)
Glucose, Bld: 103 mg/dL — ABNORMAL HIGH (ref 65–99)
Potassium: 3.7 mmol/L (ref 3.5–5.3)
Sodium: 143 mmol/L (ref 135–146)
Total Bilirubin: 0.7 mg/dL (ref 0.2–1.2)
Total Protein: 6.3 g/dL (ref 6.1–8.1)

## 2019-06-05 NOTE — Telephone Encounter (Signed)
Last Visit: 05/12/19 Next Visit: 08/19/19 Labs: 03/04/19 Total protein is borderline low-6.0. Rest of CMP WNL. CBC WNL. (labs updated 06/05/19)  Okay to refill per Dr. Corliss Skains

## 2019-08-15 NOTE — Progress Notes (Deleted)
Office Visit Note  Patient: [name]             Date of Birth: [birth year]           MRN: [id number]             PCP: Gordan Payment., MD Referring: Gordan Payment., MD Visit Date: 08/19/2019 Occupation: @GUAROCC @  Subjective:  No chief complaint on file.   History of Present Illness: EVO ADERMAN is a 64 y.o. male ***   Activities of Daily Living:  Patient reports morning stiffness for *** {minute/hour:19697}.   Patient {ACTIONS;DENIES/REPORTS:21021675::"Denies"} nocturnal pain.  Difficulty dressing/grooming: {ACTIONS;DENIES/REPORTS:21021675::"Denies"} Difficulty climbing stairs: {ACTIONS;DENIES/REPORTS:21021675::"Denies"} Difficulty getting out of chair: {ACTIONS;DENIES/REPORTS:21021675::"Denies"} Difficulty using hands for taps, buttons, cutlery, and/or writing: {ACTIONS;DENIES/REPORTS:21021675::"Denies"}  No Rheumatology ROS completed.   PMFS History:  Patient Active Problem List   Diagnosis Date Noted  . Primary osteoarthritis of both hands 02/05/2018  . History of partial knee replacement 02/05/2018  . Anxiety and depression 02/05/2018  . History of kidney stones 02/05/2018  . High risk medication use 02/05/2018  . Seronegative Inflammatory arthritis 01/15/2017    Past Medical History:  Diagnosis Date  . Arthritis   . Kidney stone     Family History  Problem Relation Age of Onset  . Cancer Mother        breast   Past Surgical History:  Procedure Laterality Date  . ANKLE ARTHROPLASTY Right    1989 right ankle reconstruction  . CARPAL TUNNEL RELEASE Bilateral   . JOINT REPLACEMENT     right knee partial   . JOINT REPLACEMENT     left knee partial  . KNEE ARTHROPLASTY    . KNEE ARTHROSCOPY    . TOTAL SHOULDER ARTHROPLASTY     Social History   Social History Narrative  . Not on file   Immunization History  Administered Date(s) Administered  . Pneumococcal Conjugate-13 08/28/2016  . Pneumococcal Polysaccharide-23 02/26/2017  . Td  05/08/2005  . Zoster Recombinat (Shingrix) 08/02/2017, 11/30/2017     Objective: Vital Signs: There were no vitals taken for this visit.   Physical Exam   Musculoskeletal Exam: ***  CDAI Exam: CDAI Score: -- Patient Global: --; Provider Global: -- Swollen: --; Tender: -- Joint Exam 08/19/2019   No joint exam has been documented for this visit   There is currently no information documented on the homunculus. Go to the Rheumatology activity and complete the homunculus joint exam.  Investigation: No additional findings.  Imaging: No results found.  Recent Labs: Lab Results  Component Value Date   WBC 3.9 06/05/2019   HGB 13.7 06/05/2019   PLT 220 06/05/2019   NA 143 06/05/2019   K 3.7 06/05/2019   CL 105 06/05/2019   CO2 31 06/05/2019   GLUCOSE 103 (H) 06/05/2019   BUN 16 06/05/2019   CREATININE 1.24 06/05/2019   BILITOT 0.7 06/05/2019   AST 18 06/05/2019   ALT 18 06/05/2019   PROT 6.3 06/05/2019   CALCIUM 9.1 06/05/2019   GFRAA 71 06/05/2019   QFTBGOLDPLUS NEGATIVE 06/06/2017    Speciality Comments: No specialty comments available.  Procedures:  No procedures performed Allergies: Nicotine   Assessment / Plan:     Visit Diagnoses: No diagnosis found.  Orders: No orders of the defined types were placed in this encounter.  No orders of the defined types were placed in this encounter.   Face-to-face time spent with patient was *** minutes. Greater than 50% of time was  spent in counseling and coordination of care.  Follow-Up Instructions: No follow-ups on file.   Ofilia Neas, PA-C  Note - This record has been created using Dragon software.  Chart creation errors have been sought, but may not always  have been located. Such creation errors do not reflect on  the standard of medical care.

## 2019-08-19 ENCOUNTER — Ambulatory Visit: Payer: Self-pay | Admitting: Rheumatology

## 2019-12-10 ENCOUNTER — Other Ambulatory Visit: Payer: Self-pay

## 2019-12-10 ENCOUNTER — Encounter: Payer: Self-pay | Admitting: Sports Medicine

## 2019-12-10 ENCOUNTER — Ambulatory Visit (INDEPENDENT_AMBULATORY_CARE_PROVIDER_SITE_OTHER): Payer: MEDICARE

## 2019-12-10 ENCOUNTER — Ambulatory Visit (INDEPENDENT_AMBULATORY_CARE_PROVIDER_SITE_OTHER): Payer: MEDICARE | Admitting: Sports Medicine

## 2019-12-10 ENCOUNTER — Other Ambulatory Visit: Payer: Self-pay | Admitting: Sports Medicine

## 2019-12-10 DIAGNOSIS — M722 Plantar fascial fibromatosis: Secondary | ICD-10-CM

## 2019-12-10 DIAGNOSIS — M79671 Pain in right foot: Secondary | ICD-10-CM | POA: Diagnosis not present

## 2019-12-10 DIAGNOSIS — B351 Tinea unguium: Secondary | ICD-10-CM

## 2019-12-10 MED ORDER — TRIAMCINOLONE ACETONIDE 10 MG/ML IJ SUSP
10.0000 mg | Freq: Once | INTRAMUSCULAR | Status: AC
  Administered 2019-12-10: 10 mg

## 2019-12-10 NOTE — Progress Notes (Signed)
Subjective: [name] is a 64 y.o. male patient presents to office with complaint of moderate heel pain on the right.  Patient admits to post static dyskinesia for 7 to 8 months in duration.  Patient has treated this problem with stretching and icing with no relief.  Pain is worse with going up stairs.  Patient also reports that he struggles with nail fungus for many years and has tried [place] and other topical without complete improvement also remembers having issues with foot fungus and athlete's foot since a teenager patient is desiring further discussion of treatment options.  Denies any other pedal complaints.   Review of systems noncontributory  Patient Active Problem List   Diagnosis Date Noted  . GERD without esophagitis 07/23/2018  . Primary osteoarthritis of both hands 02/05/2018  . History of partial knee replacement 02/05/2018  . Anxiety and depression 02/05/2018  . History of kidney stones 02/05/2018  . High risk medication use 02/05/2018  . Seronegative Inflammatory arthritis 01/15/2017  . Primary osteoarthritis involving multiple joints 08/22/2016  . Carpal tunnel syndrome of left wrist 04/13/2016  . Chronic recurrent major depressive disorder (HCC) 11/02/2015  . Malaise and fatigue 11/02/2015  . Seronegative rheumatoid arthritis (HCC) 11/02/2015    Current Outpatient Medications on File Prior to Visit  Medication Sig Dispense Refill  . amoxicillin (AMOXIL) 500 MG tablet   0  . buPROPion (WELLBUTRIN XL) 150 MG 24 hr tablet Take 450 mg by mouth.    . celecoxib (CELEBREX) 200 MG capsule TAKE 1 CAPSULE DAILY    . CIALIS 5 MG tablet Take 2.5 mg by mouth daily.  5  . CINNAMON PO Take by mouth daily.    Marland Kitchen CRANBERRY PO Take by mouth daily.    . folic acid (FOLVITE) 1 MG tablet TAKE 2 TABLETS BY MOUTH EVERY DAY 60 tablet 2  . methotrexate 50 MG/2ML injection INJECT 0.8 MILLILITERS INTO THE SKIN ONCE A WEEK 10 mL 0  . Omega-3 Fatty Acids (FISH OIL PO) Take 1,250 mg by mouth  daily.     . pantoprazole (PROTONIX) 40 MG tablet Take 40 mg by mouth daily.     Marland Kitchen sulfaSALAzine (AZULFIDINE) 500 MG EC tablet Take 1 tablet (500 mg total) by mouth 2 (two) times daily. 180 tablet 0  . Tuberculin-Allergy Syringes 27G X 1/2" 1 ML MISC Used to inject methotrexate once weekly 12 each 3  . TURMERIC PO Take by mouth daily.    Marland Kitchen VITAMIN E PO Take 400 mg by mouth daily.      No current facility-administered medications on file prior to visit.    Allergies  Allergen Reactions  . Nicotine     Objective: Physical Exam General: The patient is alert and oriented x3 in no acute distress.  Dermatology: Skin is warm, dry and supple bilateral lower extremities. Nails 1-10 are yellow thick short consistent with onychomycosis. There is no erythema, edema, no eccymosis, no open lesions present.  There is minimal reactive keratosis at the medial heel on the right.  Integument is otherwise unremarkable.  Vascular: Dorsalis Pedis pulse and Posterior Tibial pulse are 2/4 bilateral. Capillary fill time is immediate to all digits.  Neurological: Grossly intact to light touch with an achilles  bilateral.  Musculoskeletal: Tenderness to palpation at the medial calcaneal tubercale and through the insertion of the plantar fascia on the right foot. No pain with compression of calcaneus bilateral. No pain with tuning fork to calcaneus bilateral. No pain with calf compression bilateral.  There is decreased Ankle joint range of motion bilateral. All other joints range of motion within normal limits bilateral. Strength 5/5 in all groups bilateral.   Gait: Unassisted, Antalgic avoid weight on right  Xray, Right foot:  Normal osseous mineralization. Joint spaces preserved. No fracture/dislocation/boney destruction. Calcaneal spur present with mild thickening of plantar fascia. No other soft tissue abnormalities or radiopaque foreign bodies.   Assessment and Plan: Problem List Items Addressed This Visit     None    Visit Diagnoses    Plantar fasciitis of right foot    -  Primary   Pain of right heel       Nail fungus          -Complete examination performed.  -Xrays reviewed -Discussed with patient in detail the condition of plantar fasciitis, how this occurs and general treatment options. Explained both conservative and surgical treatments.  -After oral consent and aseptic prep, injected a mixture containing 1 ml of 2%  plain lidocaine, 1 ml 0.5% plain marcaine, 0.5 ml of kenalog 10 and 0.5 ml of dexamethasone phosphate into Right heel. Post-injection care discussed with patient.  -Recommended good supportive shoes and advised use of heel lifts dispensed this visit. -Explained and dispensed to patient daily stretching exercises. -Recommend patient to ice affected area 1-2x daily. -Advised patient that we can further talk about nail fungus at next visit at this time offer him laser treatment, topical compound for considering fungal culture and a prescription topical since he does not want to do oral Lamisil out of concern for affecting his liver -Patient to return to office in 2-3 weeks for follow up or sooner if problems or questions arise.  Asencion Islam, DPM

## 2019-12-10 NOTE — Patient Instructions (Signed)

## 2019-12-16 ENCOUNTER — Other Ambulatory Visit: Payer: Self-pay | Admitting: Sports Medicine

## 2019-12-16 DIAGNOSIS — M722 Plantar fascial fibromatosis: Secondary | ICD-10-CM

## 2019-12-17 ENCOUNTER — Other Ambulatory Visit: Payer: Self-pay

## 2019-12-17 ENCOUNTER — Ambulatory Visit (INDEPENDENT_AMBULATORY_CARE_PROVIDER_SITE_OTHER): Payer: MEDICARE | Admitting: Sports Medicine

## 2019-12-17 ENCOUNTER — Encounter: Payer: Self-pay | Admitting: Sports Medicine

## 2019-12-17 DIAGNOSIS — M79671 Pain in right foot: Secondary | ICD-10-CM

## 2019-12-17 DIAGNOSIS — L853 Xerosis cutis: Secondary | ICD-10-CM

## 2019-12-17 DIAGNOSIS — L84 Corns and callosities: Secondary | ICD-10-CM | POA: Diagnosis not present

## 2019-12-17 DIAGNOSIS — M722 Plantar fascial fibromatosis: Secondary | ICD-10-CM | POA: Diagnosis not present

## 2019-12-17 DIAGNOSIS — B351 Tinea unguium: Secondary | ICD-10-CM

## 2019-12-17 NOTE — Progress Notes (Signed)
Subjective: [name] is a 64 y.o. male returns to office for follow up evaluation after right heel injection for plantar fasciitis, injection #1 administered 1 week ago. Patient states that the injection seems to help his pain some less painful when he attempts to stretch but pain is still present 3 out of 10, Decreased in frequency to the area. Patient denies any recent changes in medications or new problems since last visit.   Patient Active Problem List   Diagnosis Date Noted  . GERD without esophagitis 07/23/2018  . Primary osteoarthritis of both hands 02/05/2018  . History of partial knee replacement 02/05/2018  . Anxiety and depression 02/05/2018  . History of kidney stones 02/05/2018  . High risk medication use 02/05/2018  . Seronegative Inflammatory arthritis 01/15/2017  . Primary osteoarthritis involving multiple joints 08/22/2016  . Carpal tunnel syndrome of left wrist 04/13/2016  . Chronic recurrent major depressive disorder (HCC) 11/02/2015  . Malaise and fatigue 11/02/2015  . Seronegative rheumatoid arthritis (HCC) 11/02/2015    Current Outpatient Medications on File Prior to Visit  Medication Sig Dispense Refill  . amoxicillin (AMOXIL) 500 MG tablet   0  . buPROPion (WELLBUTRIN XL) 150 MG 24 hr tablet Take 450 mg by mouth.    . celecoxib (CELEBREX) 200 MG capsule TAKE 1 CAPSULE DAILY    . CIALIS 5 MG tablet Take 2.5 mg by mouth daily.  5  . CINNAMON PO Take by mouth daily.    Marland Kitchen CRANBERRY PO Take by mouth daily.    . folic acid (FOLVITE) 1 MG tablet TAKE 2 TABLETS BY MOUTH EVERY DAY 60 tablet 2  . methotrexate 50 MG/2ML injection INJECT 0.8 MILLILITERS INTO THE SKIN ONCE A WEEK 10 mL 0  . Omega-3 Fatty Acids (FISH OIL PO) Take 1,250 mg by mouth daily.     . pantoprazole (PROTONIX) 40 MG tablet Take 40 mg by mouth daily.     Marland Kitchen sulfaSALAzine (AZULFIDINE) 500 MG EC tablet Take 1 tablet (500 mg total) by mouth 2 (two) times daily. 180 tablet 0  . Tuberculin-Allergy  Syringes 27G X 1/2" 1 ML MISC Used to inject methotrexate once weekly 12 each 3  . TURMERIC PO Take by mouth daily.    Marland Kitchen VITAMIN E PO Take 400 mg by mouth daily.      No current facility-administered medications on file prior to visit.    Allergies  Allergen Reactions  . Nicotine     Objective:   General:  Alert and oriented x 3, in no acute distress  Dermatology: Skin is warm, dry, and supple bilateral. Nails are thickened discolored.  Minimal callus subfirst toes bilateral and dry skin to heels.  There is no lower extremity erythema, no eccymosis, no open lesions present bilateral.   Vascular: Dorsalis Pedis and Posterior Tibial pedal pulses are 2/4 bilateral. + hair growth noted bilateral. Capillary Fill Time is 3 seconds in all digits. No varicosities, No edema bilateral lower extremities.   Neurological: Sensation grossly intact to light touch bilateral.  Musculoskeletal: There is mild tenderness to palpation at the medial calcaneal tubercale and through the insertion of the plantar fascia on the right foot.  There is decreased Ankle joint range of motion bilateral. All other jointsrange of motion  within normal limits bilateral. Strength 5/5 bilateral.   Assessment and Plan: Problem List Items Addressed This Visit    None    Visit Diagnoses    Plantar fasciitis of right foot    -  Primary   Pain of right heel       Dry skin       Callus       Nail fungus          -Complete examination performed.  -Previous x-rays reviewed. -Re-Discussed with patient in detail the condition of plantar fasciitis, how this  occurs related to the foot type of the patient and general treatment options. -Since pain has improved a little no reinjection done this visit since one was applied last week  -Dispensed plantar fascial brace for patient to wear as instructed on the right -Continue with stretching, icing, good supportive shoes, inserts daily.  -Discussed long term care and  reocurrence; will closely monitor; if fails to improve will consider other treatment modalities.  -Advised patient to use pumice stone and skin moisturizer for callus, sample of foot miracle cream provided -Advised patient at next visit we will further discuss nail fungus treatment -Patient to return to office in 3 weeks for follow up or sooner if problems or questions arise.  Asencion Islam, DPM

## 2020-01-07 ENCOUNTER — Other Ambulatory Visit: Payer: Self-pay

## 2020-01-07 ENCOUNTER — Encounter: Payer: Self-pay | Admitting: Sports Medicine

## 2020-01-07 ENCOUNTER — Ambulatory Visit (INDEPENDENT_AMBULATORY_CARE_PROVIDER_SITE_OTHER): Payer: MEDICARE | Admitting: Sports Medicine

## 2020-01-07 DIAGNOSIS — L84 Corns and callosities: Secondary | ICD-10-CM

## 2020-01-07 DIAGNOSIS — M79671 Pain in right foot: Secondary | ICD-10-CM

## 2020-01-07 DIAGNOSIS — M722 Plantar fascial fibromatosis: Secondary | ICD-10-CM | POA: Diagnosis not present

## 2020-01-07 MED ORDER — TRIAMCINOLONE ACETONIDE 40 MG/ML IJ SUSP
20.0000 mg | Freq: Once | INTRAMUSCULAR | Status: AC
  Administered 2020-01-07: 20 mg

## 2020-01-07 NOTE — Progress Notes (Signed)
Subjective: [name] is a 64 y.o. male returns to office for follow up evaluation of right heel pain and plantar fasciitis.  Patient reports that it seemed like he is doing better he can tell a difference with the stay off of his foot but however at the end of the day there is still some tenderness 3 out of 10.  Patient has been compliant with stretching icing elevating and wearing this brace without any issues. Patient denies any recent changes in medications or new problems since last visit.   Patient Active Problem List   Diagnosis Date Noted   GERD without esophagitis 07/23/2018   Primary osteoarthritis of both hands 02/05/2018   History of partial knee replacement 02/05/2018   Anxiety and depression 02/05/2018   History of kidney stones 02/05/2018   High risk medication use 02/05/2018   Seronegative Inflammatory arthritis 01/15/2017   Primary osteoarthritis involving multiple joints 08/22/2016   Carpal tunnel syndrome of left wrist 04/13/2016   Chronic recurrent major depressive disorder (HCC) 11/02/2015   Malaise and fatigue 11/02/2015   Seronegative rheumatoid arthritis (HCC) 11/02/2015    Current Outpatient Medications on File Prior to Visit  Medication Sig Dispense Refill   amoxicillin (AMOXIL) 500 MG tablet   0   buPROPion (WELLBUTRIN XL) 150 MG 24 hr tablet Take 450 mg by mouth.     celecoxib (CELEBREX) 200 MG capsule TAKE 1 CAPSULE DAILY     CIALIS 5 MG tablet Take 2.5 mg by mouth daily.  5   CINNAMON PO Take by mouth daily.     CRANBERRY PO Take by mouth daily.     folic acid (FOLVITE) 1 MG tablet TAKE 2 TABLETS BY MOUTH EVERY DAY 60 tablet 2   methotrexate 50 MG/2ML injection INJECT 0.8 MILLILITERS INTO THE SKIN ONCE A WEEK 10 mL 0   Omega-3 Fatty Acids (FISH OIL PO) Take 1,250 mg by mouth daily.      pantoprazole (PROTONIX) 40 MG tablet Take 40 mg by mouth daily.      sulfaSALAzine (AZULFIDINE) 500 MG EC tablet Take 1 tablet (500 mg total) by  mouth 2 (two) times daily. 180 tablet 0   Tuberculin-Allergy Syringes 27G X 1/2" 1 ML MISC Used to inject methotrexate once weekly 12 each 3   TURMERIC PO Take by mouth daily.     VITAMIN E PO Take 400 mg by mouth daily.      No current facility-administered medications on file prior to visit.    Allergies  Allergen Reactions   Nicotine     Objective:   General:  Alert and oriented x 3, in no acute distress  Dermatology: Skin is warm, dry, and supple bilateral. Nails are short, thickened discolored.  Minimal callus subfirst toes bilateral and dry skin to heels like previous.  There is no lower extremity erythema, no eccymosis, no open lesions present bilateral.   Vascular: Dorsalis Pedis and Posterior Tibial pedal pulses are 2/4 bilateral. + hair growth noted bilateral. Capillary Fill Time is 3 seconds in all digits. No varicosities, No edema bilateral lower extremities.   Neurological: Sensation grossly intact to light touch bilateral.  Musculoskeletal: There is mild tenderness to palpation at the medial calcaneal tubercale and through the insertion of the plantar fascia on the right foot.  There is decreased Ankle joint range of motion bilateral. All other jointsrange of motion  within normal limits bilateral. Strength 5/5 bilateral.   Assessment and Plan: Problem List Items Addressed This Visit  None    Visit Diagnoses    Plantar fasciitis of right foot    -  Primary   Pain of right heel       Callus          -Complete examination performed.  -Previous x-rays reviewed. -Re-Discussed with patient in detail the condition of plantar fasciitis, how this  occurs related to the foot type of the patient and general treatment options. -After oral consent and aseptic prep, injected a mixture containing 1 ml of 2%  plain lidocaine, 1 ml 0.5% plain marcaine, 0.5 ml of kenalog 40 and 0.5 ml of dexamethasone phosphate into right heel medial approach at glabrous junction without  complication. Post-injection care discussed with patient.  -Continue with plantar fascial brace for patient to wear as instructed on the right -Continue with stretching, icing, good supportive shoes, inserts daily.  -Discussed long term care and reocurrence; will closely monitor; if fails to improve will consider other treatment modalities.  -Continue with skin softening agents and for now we will continue with monitoring nail fungus and will talk about further treatments at a subsequent visit once his heel pain is better -Return in 3 to 4 weeks for follow-up right fasciitis Asencion Islam, DPM

## 2020-02-04 ENCOUNTER — Ambulatory Visit (INDEPENDENT_AMBULATORY_CARE_PROVIDER_SITE_OTHER): Payer: MEDICARE | Admitting: Sports Medicine

## 2020-02-04 ENCOUNTER — Other Ambulatory Visit: Payer: Self-pay

## 2020-02-04 ENCOUNTER — Encounter: Payer: Self-pay | Admitting: Sports Medicine

## 2020-02-04 DIAGNOSIS — M79671 Pain in right foot: Secondary | ICD-10-CM | POA: Diagnosis not present

## 2020-02-04 DIAGNOSIS — B351 Tinea unguium: Secondary | ICD-10-CM | POA: Diagnosis not present

## 2020-02-04 DIAGNOSIS — M722 Plantar fascial fibromatosis: Secondary | ICD-10-CM

## 2020-02-04 DIAGNOSIS — L853 Xerosis cutis: Secondary | ICD-10-CM

## 2020-02-04 NOTE — Progress Notes (Signed)
Subjective: [name] is a 64 y.o. male returns to office for follow up evaluation of right heel pain.  Patient reports that heel was doing much better he went to the beach and was doing good and when he came back it did hurt for a few days but after that when he got some gel inserts the pain went away.  Patient also reports that he wants to further discuss treatment options for nail fungus.  Patient denies any other pedal complaints at this time.  Patient Active Problem List   Diagnosis Date Noted   GERD without esophagitis 07/23/2018   Primary osteoarthritis of both hands 02/05/2018   History of partial knee replacement 02/05/2018   Anxiety and depression 02/05/2018   History of kidney stones 02/05/2018   High risk medication use 02/05/2018   Seronegative Inflammatory arthritis 01/15/2017   Primary osteoarthritis involving multiple joints 08/22/2016   Carpal tunnel syndrome of left wrist 04/13/2016   Chronic recurrent major depressive disorder (HCC) 11/02/2015   Malaise and fatigue 11/02/2015   Seronegative rheumatoid arthritis (HCC) 11/02/2015    Current Outpatient Medications on File Prior to Visit  Medication Sig Dispense Refill   amoxicillin (AMOXIL) 500 MG tablet   0   buPROPion (WELLBUTRIN XL) 150 MG 24 hr tablet Take 450 mg by mouth.     celecoxib (CELEBREX) 200 MG capsule TAKE 1 CAPSULE DAILY     CIALIS 5 MG tablet Take 2.5 mg by mouth daily.  5   CINNAMON PO Take by mouth daily.     CRANBERRY PO Take by mouth daily.     folic acid (FOLVITE) 1 MG tablet TAKE 2 TABLETS BY MOUTH EVERY DAY 60 tablet 2   methotrexate 50 MG/2ML injection INJECT 0.8 MILLILITERS INTO THE SKIN ONCE A WEEK 10 mL 0   Omega-3 Fatty Acids (FISH OIL PO) Take 1,250 mg by mouth daily.      pantoprazole (PROTONIX) 40 MG tablet Take 40 mg by mouth daily.      sulfaSALAzine (AZULFIDINE) 500 MG EC tablet Take 1 tablet (500 mg total) by mouth 2 (two) times daily. 180 tablet 0    Tuberculin-Allergy Syringes 27G X 1/2" 1 ML MISC Used to inject methotrexate once weekly 12 each 3   TURMERIC PO Take by mouth daily.     VITAMIN E PO Take 400 mg by mouth daily.      No current facility-administered medications on file prior to visit.    Allergies  Allergen Reactions   Nicotine     Objective:   General:  Alert and oriented x 3, in no acute distress  Dermatology: Skin is warm, dry, and supple bilateral. Nails are mildly elongated thickened discolored.  Minimal callus subfirst toes bilateral and dry skin to heels like previous.  There is no lower extremity erythema, no eccymosis, no open lesions present bilateral.   Vascular: Dorsalis Pedis and Posterior Tibial pedal pulses are 2/4 bilateral. + hair growth noted bilateral. Capillary Fill Time is 3 seconds in all digits. No varicosities, No edema bilateral lower extremities.   Neurological: Sensation grossly intact to light touch bilateral.  Musculoskeletal: There is resolved tenderness to palpation at the medial calcaneal tubercale and through the insertion of the plantar fascia on the right foot.  There is decreased Ankle joint range of motion bilateral. All other jointsrange of motion  within normal limits bilateral. Strength 5/5 bilateral.   Assessment and Plan: Problem List Items Addressed This Visit    None  Visit Diagnoses    Plantar fasciitis of right foot    -  Primary   Pain of right heel       Nail fungus       Dry skin          -Complete examination performed.  -Rediscussed with patient long-term care of plantar fasciitis since symptoms have much improved we will continue with monitoring advised patient to continue with gentle stretching rest ice elevation -Advised patient to continue with plantar fascial brace and then after 1 week may slowly wean from using brace -Continue with over-the-counter insoles and heel cushion -Advised patient of plantar fascial pain worsens to return to office sooner  for retreatment/injection -Discussed with patient treatment options for nail fungus especially in the setting of previous treatment with Jublia with past failure -Fungal culture was obtained by removing a portion of the hard nail itself from each of the involved toenails 1 through 10 using a sterile nail nipper and sent to [facility] lab. Patient tolerated the biopsy procedure well without discomfort or need for anesthesia. -Return in 4 weeks for discussion of fungal culture results and follow-up on heel pain.  Asencion Islam, DPM

## 2020-02-06 NOTE — Addendum Note (Signed)
Addended by: Hadley Pen R on: 02/06/2020 07:59 AM   Modules accepted: Orders

## 2020-03-03 ENCOUNTER — Encounter: Payer: Self-pay | Admitting: Sports Medicine

## 2020-03-03 ENCOUNTER — Ambulatory Visit (INDEPENDENT_AMBULATORY_CARE_PROVIDER_SITE_OTHER): Payer: MEDICARE | Admitting: Sports Medicine

## 2020-03-03 ENCOUNTER — Other Ambulatory Visit: Payer: Self-pay

## 2020-03-03 DIAGNOSIS — B351 Tinea unguium: Secondary | ICD-10-CM | POA: Diagnosis not present

## 2020-03-03 DIAGNOSIS — B359 Dermatophytosis, unspecified: Secondary | ICD-10-CM | POA: Diagnosis not present

## 2020-03-03 DIAGNOSIS — M79671 Pain in right foot: Secondary | ICD-10-CM

## 2020-03-03 DIAGNOSIS — M722 Plantar fascial fibromatosis: Secondary | ICD-10-CM

## 2020-03-03 DIAGNOSIS — L853 Xerosis cutis: Secondary | ICD-10-CM

## 2020-03-03 NOTE — Progress Notes (Signed)
Subjective: [name] is a 64 y.o. male returns to office for discussion of fungal culture results and follow-up on right heel pain.  Patient reports that the right heel does not bother him but occasionally may have a little soreness but otherwise is doing much better stretching has helped greatly.  Patient denies any changes with medical history since last encounter.  Patient Active Problem List   Diagnosis Date Noted  . GERD without esophagitis 07/23/2018  . Primary osteoarthritis of both hands 02/05/2018  . History of partial knee replacement 02/05/2018  . Anxiety and depression 02/05/2018  . History of kidney stones 02/05/2018  . High risk medication use 02/05/2018  . Seronegative Inflammatory arthritis 01/15/2017  . Primary osteoarthritis involving multiple joints 08/22/2016  . Carpal tunnel syndrome of left wrist 04/13/2016  . Chronic recurrent major depressive disorder (HCC) 11/02/2015  . Malaise and fatigue 11/02/2015  . Seronegative rheumatoid arthritis (HCC) 11/02/2015    Current Outpatient Medications on File Prior to Visit  Medication Sig Dispense Refill  . amoxicillin (AMOXIL) 500 MG tablet   0  . buPROPion (WELLBUTRIN XL) 150 MG 24 hr tablet Take 450 mg by mouth.    . celecoxib (CELEBREX) 200 MG capsule TAKE 1 CAPSULE DAILY    . CIALIS 5 MG tablet Take 2.5 mg by mouth daily.  5  . CINNAMON PO Take by mouth daily.    Marland Kitchen CRANBERRY PO Take by mouth daily.    . folic acid (FOLVITE) 1 MG tablet TAKE 2 TABLETS BY MOUTH EVERY DAY 60 tablet 2  . methotrexate 50 MG/2ML injection INJECT 0.8 MILLILITERS INTO THE SKIN ONCE A WEEK 10 mL 0  . Omega-3 Fatty Acids (FISH OIL PO) Take 1,250 mg by mouth daily.     . pantoprazole (PROTONIX) 40 MG tablet Take 40 mg by mouth daily.     Marland Kitchen sulfaSALAzine (AZULFIDINE) 500 MG EC tablet Take 1 tablet (500 mg total) by mouth 2 (two) times daily. 180 tablet 0  . Tuberculin-Allergy Syringes 27G X 1/2" 1 ML MISC Used to inject methotrexate once  weekly 12 each 3  . TURMERIC PO Take by mouth daily.    Marland Kitchen VITAMIN E PO Take 400 mg by mouth daily.      No current facility-administered medications on file prior to visit.    Allergies  Allergen Reactions  . Nicotine     Objective:   General:  Alert and oriented x 3, in no acute distress  Dermatology: Skin is warm, dry, and supple bilateral. Nails are short thickened discolored.  Minimal callus subfirst toes bilateral and dry skin to heels like previous increased scaling interdigitally at the right fourth and fifth toes as well as to the lateral foot left greater than right.  There is no lower extremity erythema, no eccymosis, no open lesions present bilateral.   Vascular: Dorsalis Pedis and Posterior Tibial pedal pulses are 2/4 bilateral. + hair growth noted bilateral. Capillary Fill Time is 3 seconds in all digits. No varicosities, No edema bilateral lower extremities.   Neurological: Sensation grossly intact to light touch bilateral.  Musculoskeletal: There is resolved tenderness to palpation at the medial calcaneal tubercale and through the insertion of the plantar fascia on the right foot.  There is decreased Ankle joint range of motion bilateral. All other jointsrange of motion  within normal limits bilateral. Strength 5/5 bilateral.   Assessment and Plan: Problem List Items Addressed This Visit    None    Visit Diagnoses  Nail fungus    -  Primary   Tinea       Dry skin       Plantar fasciitis of right foot       Pain of right heel          -Complete examination performed.  -Advised patient to continue with stretching to prevent recurrence of plantar fasciitis -Continue with over-the-counter insoles and heel cushion like previous -Discussed with patient treatment options for fungus -Fungal culture positive for microtrauma fungus and yeast -Patient wants to consider the treatment options and wants to further discuss this with his rheumatologist -Advised patient if  he elects to try oral Lamisil he must have updated blood work including liver panel before we can start this medication of 250 mg daily x90 days -Return as needed or after blood work or if problems or issues arise  Asencion Islam, DPM

## 2020-06-25 DIAGNOSIS — E782 Mixed hyperlipidemia: Secondary | ICD-10-CM | POA: Insufficient documentation

## 2020-07-22 ENCOUNTER — Other Ambulatory Visit: Payer: Self-pay

## 2020-07-22 ENCOUNTER — Encounter: Payer: Self-pay | Admitting: Physician Assistant

## 2020-07-22 ENCOUNTER — Ambulatory Visit (INDEPENDENT_AMBULATORY_CARE_PROVIDER_SITE_OTHER): Payer: MEDICARE | Admitting: Physician Assistant

## 2020-07-22 VITALS — BP 129/75 | HR 73 | Resp 14 | Ht 70.0 in | Wt 181.6 lb

## 2020-07-22 DIAGNOSIS — M19042 Primary osteoarthritis, left hand: Secondary | ICD-10-CM

## 2020-07-22 DIAGNOSIS — Z79899 Other long term (current) drug therapy: Secondary | ICD-10-CM

## 2020-07-22 DIAGNOSIS — M1990 Unspecified osteoarthritis, unspecified site: Secondary | ICD-10-CM | POA: Diagnosis not present

## 2020-07-22 DIAGNOSIS — F419 Anxiety disorder, unspecified: Secondary | ICD-10-CM

## 2020-07-22 DIAGNOSIS — M19041 Primary osteoarthritis, right hand: Secondary | ICD-10-CM | POA: Diagnosis not present

## 2020-07-22 DIAGNOSIS — G5602 Carpal tunnel syndrome, left upper limb: Secondary | ICD-10-CM

## 2020-07-22 DIAGNOSIS — Z96659 Presence of unspecified artificial knee joint: Secondary | ICD-10-CM

## 2020-07-22 DIAGNOSIS — Z87442 Personal history of urinary calculi: Secondary | ICD-10-CM

## 2020-07-22 DIAGNOSIS — F32A Depression, unspecified: Secondary | ICD-10-CM

## 2020-07-22 LAB — CBC WITH DIFFERENTIAL/PLATELET
Absolute Monocytes: 620 cells/uL (ref 200–950)
Basophils Absolute: 50 cells/uL (ref 0–200)
Basophils Relative: 1 %
Eosinophils Absolute: 110 cells/uL (ref 15–500)
Eosinophils Relative: 2.2 %
HCT: 43.7 % (ref 38.5–50.0)
Hemoglobin: 14.4 g/dL (ref 13.2–17.1)
Lymphs Abs: 1355 cells/uL (ref 850–3900)
MCH: 31.4 pg (ref 27.0–33.0)
MCHC: 33 g/dL (ref 32.0–36.0)
MCV: 95.4 fL (ref 80.0–100.0)
MPV: 10.8 fL (ref 7.5–12.5)
Monocytes Relative: 12.4 %
Neutro Abs: 2865 cells/uL (ref 1500–7800)
Neutrophils Relative %: 57.3 %
Platelets: 223 10*3/uL (ref 140–400)
RBC: 4.58 10*6/uL (ref 4.20–5.80)
RDW: 13.3 % (ref 11.0–15.0)
Total Lymphocyte: 27.1 %
WBC: 5 10*3/uL (ref 3.8–10.8)

## 2020-07-22 LAB — COMPLETE METABOLIC PANEL WITH GFR
AG Ratio: 2.3 (calc) (ref 1.0–2.5)
ALT: 18 U/L (ref 9–46)
AST: 20 U/L (ref 10–35)
Albumin: 4.4 g/dL (ref 3.6–5.1)
Alkaline phosphatase (APISO): 58 U/L (ref 35–144)
BUN: 18 mg/dL (ref 7–25)
CO2: 29 mmol/L (ref 20–32)
Calcium: 9.4 mg/dL (ref 8.6–10.3)
Chloride: 105 mmol/L (ref 98–110)
Creat: 1.01 mg/dL (ref 0.70–1.25)
GFR, Est African American: 90 mL/min/{1.73_m2} (ref >=60)
GFR, Est Non African American: 78 mL/min/{1.73_m2} (ref >=60)
Globulin: 1.9 g/dL (calc) (ref 1.9–3.7)
Glucose, Bld: 93 mg/dL (ref 65–99)
Potassium: 4.1 mmol/L (ref 3.5–5.3)
Sodium: 141 mmol/L (ref 135–146)
Total Bilirubin: 1.1 mg/dL (ref 0.2–1.2)
Total Protein: 6.3 g/dL (ref 6.1–8.1)

## 2020-07-22 MED ORDER — FOLIC ACID 1 MG PO TABS
2.0000 mg | ORAL_TABLET | Freq: Every day | ORAL | 3 refills | Status: AC
Start: 2020-07-22 — End: ?

## 2020-07-22 MED ORDER — METHOTREXATE SODIUM CHEMO INJECTION 50 MG/2ML: INTRAMUSCULAR | 0 refills | Status: DC

## 2020-07-22 MED ORDER — "TUBERCULIN-ALLERGY SYRINGES 27G X 1/2"" 1 ML MISC": 3 refills | Status: DC

## 2020-07-22 MED ORDER — SULFASALAZINE 500 MG PO TBEC: 1000.0000 mg | DELAYED_RELEASE_TABLET | Freq: Two times a day (BID) | ORAL | 0 refills | Status: DC

## 2020-07-22 NOTE — Progress Notes (Unsigned)
Patient requested refills of MTX and SSZ today at the appointment.   Please review and send to the pharmacy.

## 2020-07-22 NOTE — Patient Instructions (Signed)
Standing Labs We placed an order today for your standing lab work.   Please have your standing labs drawn in June and every 3 months   If possible, please have your labs drawn 2 weeks prior to your appointment so that the provider can discuss your results at your appointment.  We have open lab daily Monday through Thursday from 1:30-4:30 PM and Friday from 1:30-4:00 PM at the office of Dr. Shaili Deveshwar, [facility].   Please be advised, all patients with office appointments requiring lab work will take precedents over walk-in lab work.  If possible, please come for your lab work on Monday and Friday afternoons, as you may experience shorter wait times. The office is located at [address], [address], [place], [place] [address] No appointment is necessary.   Labs are drawn by [facility]. Please bring your co-pay at the time of your lab draw.  You may receive a bill from [facility] for your lab work.  If you wish to have your labs drawn at another location, please call the office 24 hours in advance to send orders.  If you have any questions regarding directions or hours of operation,  please call [phone].   As a reminder, please drink plenty of water prior to coming for your lab work. Thanks!   Hand Exercises Hand exercises can be helpful for almost anyone. These exercises can strengthen the hands, improve flexibility and movement, and increase blood flow to the hands. These results can make work and daily tasks easier. Hand exercises can be especially helpful for people who have joint pain from arthritis or have nerve damage from overuse (carpal tunnel syndrome). These exercises can also help people who have injured a hand. Exercises Most of these hand exercises are gentle stretching and motion exercises. It is usually safe to do them often throughout the day. Warming up your hands before exercise may help to reduce stiffness. You can do this with gentle massage or by placing  your hands in warm water for 10-15 minutes. It is normal to feel some stretching, pulling, tightness, or mild discomfort as you begin new exercises. This will gradually improve. Stop an exercise right away if you feel sudden, severe pain or your pain gets worse. Ask your health care provider which exercises are best for you. Knuckle bend or "claw" fist 1. Stand or sit with your arm, hand, and all five fingers pointed straight up. Make sure to keep your wrist straight during the exercise. 2. Gently bend your fingers down toward your palm until the tips of your fingers are touching the top of your palm. Keep your big knuckle straight and just bend the small knuckles in your fingers. 3. Hold this position for __________ seconds. 4. Straighten (extend) your fingers back to the starting position. Repeat this exercise 5-10 times with each hand. Full finger fist 1. Stand or sit with your arm, hand, and all five fingers pointed straight up. Make sure to keep your wrist straight during the exercise. 2. Gently bend your fingers into your palm until the tips of your fingers are touching the middle of your palm. 3. Hold this position for __________ seconds. 4. Extend your fingers back to the starting position, stretching every joint fully. Repeat this exercise 5-10 times with each hand. Straight fist 1. Stand or sit with your arm, hand, and all five fingers pointed straight up. Make sure to keep your wrist straight during the exercise. 2. Gently bend your fingers at the big   knuckle, where your fingers meet your hand, and the middle knuckle. Keep the knuckle at the tips of your fingers straight and try to touch the bottom of your palm. 3. Hold this position for __________ seconds. 4. Extend your fingers back to the starting position, stretching every joint fully. Repeat this exercise 5-10 times with each hand. Tabletop 1. Stand or sit with your arm, hand, and all five fingers pointed straight up. Make sure  to keep your wrist straight during the exercise. 2. Gently bend your fingers at the big knuckle, where your fingers meet your hand, as far down as you can while keeping the small knuckles in your fingers straight. Think of forming a tabletop with your fingers. 3. Hold this position for __________ seconds. 4. Extend your fingers back to the starting position, stretching every joint fully. Repeat this exercise 5-10 times with each hand. Finger spread 1. Place your hand flat on a table with your palm facing down. Make sure your wrist stays straight as you do this exercise. 2. Spread your fingers and thumb apart from each other as far as you can until you feel a gentle stretch. Hold this position for __________ seconds. 3. Bring your fingers and thumb tight together again. Hold this position for __________ seconds. Repeat this exercise 5-10 times with each hand. Making circles 1. Stand or sit with your arm, hand, and all five fingers pointed straight up. Make sure to keep your wrist straight during the exercise. 2. Make a circle by touching the tip of your thumb to the tip of your index finger. 3. Hold for __________ seconds. Then open your hand wide. 4. Repeat this motion with your thumb and each finger on your hand. Repeat this exercise 5-10 times with each hand. Thumb motion 1. Sit with your forearm resting on a table and your wrist straight. Your thumb should be facing up toward the ceiling. Keep your fingers relaxed as you move your thumb. 2. Lift your thumb up as high as you can toward the ceiling. Hold for __________ seconds. 3. Bend your thumb across your palm as far as you can, reaching the tip of your thumb for the small finger (pinkie) side of your palm. Hold for __________ seconds. Repeat this exercise 5-10 times with each hand. Grip strengthening 1. Hold a stress ball or other soft ball in the middle of your hand. 2. Slowly increase the pressure, squeezing the ball as much as you can  without causing pain. Think of bringing the tips of your fingers into the middle of your palm. All of your finger joints should bend when doing this exercise. 3. Hold your squeeze for __________ seconds, then relax. Repeat this exercise 5-10 times with each hand.   Contact a health care provider if:  Your hand pain or discomfort gets much worse when you do an exercise.  Your hand pain or discomfort does not improve within 2 hours after you exercise. If you have any of these problems, stop doing these exercises right away. Do not do them again unless your health care provider says that you can. Get help right away if:  You develop sudden, severe hand pain or swelling. If this happens, stop doing these exercises right away. Do not do them again unless your health care provider says that you can. This information is not intended to replace advice given to you by your health care provider. Make sure you discuss any questions you have with your health care provider. Document Revised:   08/15/2018 Document Reviewed: 04/25/2018 [employer] Patient Education  2021 [employer]  

## 2020-07-22 NOTE — Progress Notes (Signed)
Office Visit Note  Patient: [name]             Date of Birth: [birth year]           MRN: [id number]             PCP: Gordan Payment., MD Referring: Gordan Payment., MD Visit Date: 07/22/2020 Occupation: @GUAROCC @  Subjective:  Right hand pain   History of Present Illness: [name] is a 65 y.o. male with history of seronegative rheumatoid arthritis and osteoarthritis.  He has been off of injectable methotrexate and sulfasalazine for the past 3 months.  According to the patient he has had issues with his insurance switching over to Medicare so has not had updated lab work or an office visit.  Patient reports that he has noticed some increased pain and stiffness in his right hand.  He also has discomfort in both shoulder joints especially the left shoulder at night.  He states that both partial knee replacements are doing well.  He takes Celebrex 200 mg 1 capsule by mouth daily as needed for pain relief, which has been alleviating most of his joint pain.  Patient reports that he tries to stay active and has been stretching on a regular basis.  He plans on starting to ride his bike more now that the weather has warmed up. He denies any recent infections.   Activities of Daily Living:  Patient reports morning stiffness for 0 minutes.   Patient Denies nocturnal pain.  Difficulty dressing/grooming: Denies Difficulty climbing stairs: Denies Difficulty getting out of chair: Denies Difficulty using hands for taps, buttons, cutlery, and/or writing: Denies  Review of Systems  Constitutional: Negative for fatigue.  HENT: Negative for mouth sores, mouth dryness and nose dryness.   Eyes: Negative for pain, itching and dryness.  Respiratory: Negative for shortness of breath and difficulty breathing.   Cardiovascular: Negative for chest pain and palpitations.  Gastrointestinal: Negative for blood in stool, constipation and diarrhea.  Endocrine: Negative for increased urination.   Genitourinary: Negative for difficulty urinating.  Musculoskeletal: Positive for arthralgias and joint pain. Negative for joint swelling, myalgias, morning stiffness, muscle tenderness and myalgias.  Skin: Negative for color change, rash and redness.  Allergic/Immunologic: Negative for susceptible to infections.  Neurological: Negative for dizziness, numbness, headaches, memory loss and weakness.  Hematological: Positive for bruising/bleeding tendency.  Psychiatric/Behavioral: Positive for sleep disturbance. Negative for confusion.    PMFS History:  Patient Active Problem List   Diagnosis Date Noted  . GERD without esophagitis 07/23/2018  . Primary osteoarthritis of both hands 02/05/2018  . History of partial knee replacement 02/05/2018  . Anxiety and depression 02/05/2018  . History of kidney stones 02/05/2018  . High risk medication use 02/05/2018  . Seronegative Inflammatory arthritis 01/15/2017  . Primary osteoarthritis involving multiple joints 08/22/2016  . Carpal tunnel syndrome of left wrist 04/13/2016  . Chronic recurrent major depressive disorder (HCC) 11/02/2015  . Malaise and fatigue 11/02/2015  . Seronegative rheumatoid arthritis (HCC) 11/02/2015    Past Medical History:  Diagnosis Date  . Arthritis   . Kidney stone     Family History  Problem Relation Age of Onset  . Cancer Mother        breast   Past Surgical History:  Procedure Laterality Date  . ANKLE ARTHROPLASTY Right    1989 right ankle reconstruction  . CARPAL TUNNEL RELEASE Bilateral   . JOINT REPLACEMENT     right knee partial   .  JOINT REPLACEMENT     left knee partial  . KNEE ARTHROPLASTY    . KNEE ARTHROSCOPY    . TOTAL SHOULDER ARTHROPLASTY     Social History   Social History Narrative  . Not on file   Immunization History  Administered Date(s) Administered  . Janssen (J&J) SARS-COV-2 Vaccination 10/15/2019  . Moderna Sars-Covid-2 Vaccination 05/12/2020  . Pneumococcal Conjugate-13  08/28/2016  . Pneumococcal Polysaccharide-23 02/26/2017  . Td 05/08/2005  . Zoster Recombinat (Shingrix) 08/02/2017, 11/30/2017     Objective: Vital Signs: BP 129/75 (BP Location: Right Arm, Patient Position: Sitting, Cuff Size: Normal)   Pulse 73   Resp 14   Ht 5\' 10"  (1.778 m)   Wt 181 lb 9.6 oz (82.4 kg)   BMI 26.06 kg/m    Physical Exam Vitals and nursing note reviewed.  Constitutional:      Appearance: He is well-developed.  HENT:     Head: Normocephalic and atraumatic.  Eyes:     Conjunctiva/sclera: Conjunctivae normal.     Pupils: Pupils are equal, round, and reactive to light.  Pulmonary:     Effort: Pulmonary effort is normal.  Abdominal:     Palpations: Abdomen is soft.  Musculoskeletal:     Cervical back: Normal range of motion and neck supple.  Skin:    General: Skin is warm and dry.     Capillary Refill: Capillary refill takes less than 2 seconds.  Neurological:     Mental Status: He is alert and oriented to person, place, and time.  Psychiatric:        Behavior: Behavior normal.      Musculoskeletal Exam: C-spine has slightly limited range of motion with lateral rotation.  Thoracic and lumbar spine have good range of motion with no discomfort.  Shoulder joints have good range of motion with mild discomfort in the left shoulder.  Elbow joints, wrist joints, MCPs, PIPs, DIPs have good range of motion with no synovitis.  Synovial thickening over the right second through fifth MCP joints.  PIP and DIP thickening consistent with osteoarthritis of both hands noted.  Hip joints have good range of motion with no discomfort.  Partial knee replacements have good range of motion with no discomfort.  Ankle joints have good range of motion with no tenderness or inflammation.  CDAI Exam: CDAI Score: 5.6  Patient Global: 3 mm; Provider Global: 3 mm Swollen: 0 ; Tender: 5  Joint Exam 07/22/2020      Right  Left  Glenohumeral      Tender  MCP 2   Tender     MCP 3    Tender     MCP 4   Tender     MCP 5   Tender        Investigation: No additional findings.  Imaging: No results found.  Recent Labs: Lab Results  Component Value Date   WBC 3.9 06/05/2019   HGB 13.7 06/05/2019   PLT 220 06/05/2019   NA 143 06/05/2019   K 3.7 06/05/2019   CL 105 06/05/2019   CO2 31 06/05/2019   GLUCOSE 103 (H) 06/05/2019   BUN 16 06/05/2019   CREATININE 1.24 06/05/2019   BILITOT 0.7 06/05/2019   AST 18 06/05/2019   ALT 18 06/05/2019   PROT 6.3 06/05/2019   CALCIUM 9.1 06/05/2019   GFRAA 71 06/05/2019   QFTBGOLDPLUS NEGATIVE 06/06/2017    Speciality Comments: No specialty comments available.  Procedures:  No procedures performed Allergies: Nicotine  Assessment / Plan:     Visit Diagnoses: Seronegative Inflammatory arthritis: He has no synovitis on exam.  He has been experiencing increased discomfort and stiffness in both shoulder joints and both hands over the past several months.  He has been off of injectable methotrexate and sulfasalazine for the past 3 months due to requiring updated lab work and an office visit.  He has been taking Celebrex 200 mg 1 capsule by mouth daily as needed for pain relief.  He has tried to remain active exercising on a regular basis which has helped with his joint stiffness.  We will obtain CBC and CMP today.  We will provide refills of methotrexate, folic acid, and sulfasalazine.  He will continue to require lab work every 3 months to monitor for drug toxicity.  He was advised to notify us if his joint pain and stiffness does not improve on the current regimen.  He will follow-up in the office in 5 months.  High risk medication use -He will be restarting on Methotrexate 0.8 ml sq injections once weekly, folic acid 2 mg by mouth daily, and sulfasalazine 500 mg 2 tables by mouth twice daily.  CBC and CMP were drawn on 06/05/2019.  He is overdue to update lab work.  Orders for CBC and CMP were released.  His next lab work will be  due in June and every 3 months to monitor for drug toxicity.  Standing orders for CBC and CMP remain in place.  Plan: CBC with Differential/Platelet, COMPLETE METABOLIC PANEL WITH GFR He has not had any recent infections.  We discussed the importance of holding methotrexate and sulfasalazine if he develops signs or symptoms of an infection and to resume once the infection has completely cleared.  He voiced understanding.  Carpal tunnel syndrome of left wrist: Asymptomatic at this time.  Primary osteoarthritis of both hands: He has PIP and DIP thickening consistent with osteoarthritis of both hands.  He has been experiencing increased pain and stiffness in both hands.  We discussed the importance of joint protection and muscle strengthening.  He was given a handout of hand exercises to perform.  History of partial knee replacement - Left-2015 and right-2017: Doing well.  He has good range of motion with no discomfort.  Encouraged to perform lower extremity muscle strengthening and stretching exercises on a daily basis.  Other medical conditions are listed as follows:  Anxiety and depression  History of kidney stones  Orders: Orders Placed This Encounter  Procedures  . CBC with Differential/Platelet  . COMPLETE METABOLIC PANEL WITH GFR   No orders of the defined types were placed in this encounter.     Follow-Up Instructions: Return in about 5 months (around 12/22/2020) for Rheumatoid arthritis, Osteoarthritis.   Gearldine Bienenstock, PA-C  Note - This record has been created using Dragon software.  Chart creation errors have been sought, but may not always  have been located. Such creation errors do not reflect on  the standard of medical care.

## 2020-07-23 NOTE — Progress Notes (Signed)
CBC and CMP WNL

## 2020-10-13 ENCOUNTER — Ambulatory Visit (INDEPENDENT_AMBULATORY_CARE_PROVIDER_SITE_OTHER): Payer: MEDICARE

## 2020-10-13 ENCOUNTER — Other Ambulatory Visit: Payer: Self-pay

## 2020-10-13 ENCOUNTER — Encounter: Payer: Self-pay | Admitting: Sports Medicine

## 2020-10-13 ENCOUNTER — Other Ambulatory Visit: Payer: Self-pay | Admitting: Sports Medicine

## 2020-10-13 ENCOUNTER — Ambulatory Visit (INDEPENDENT_AMBULATORY_CARE_PROVIDER_SITE_OTHER): Payer: MEDICARE | Admitting: Sports Medicine

## 2020-10-13 DIAGNOSIS — M722 Plantar fascial fibromatosis: Secondary | ICD-10-CM | POA: Diagnosis not present

## 2020-10-13 DIAGNOSIS — M216X2 Other acquired deformities of left foot: Secondary | ICD-10-CM | POA: Diagnosis not present

## 2020-10-13 DIAGNOSIS — M79672 Pain in left foot: Secondary | ICD-10-CM

## 2020-10-13 DIAGNOSIS — M779 Enthesopathy, unspecified: Secondary | ICD-10-CM | POA: Diagnosis not present

## 2020-10-13 MED ORDER — TRIAMCINOLONE ACETONIDE 10 MG/ML IJ SUSP
10.0000 mg | Freq: Once | INTRAMUSCULAR | Status: AC
  Administered 2020-10-13: 10 mg

## 2020-10-13 NOTE — Progress Notes (Signed)
Subjective: [name] is a 65 y.o. male patient presents to office with complaint of midl to moderate heel pain on the left and swelling to lateral side of heel. Patient admits to post static dyskinesia for 1 month in duration. Patient has treated this problem with stretching and rolling a ball with no relief. Denies any other pedal complaints.   Patient Active Problem List   Diagnosis Date Noted  . GERD without esophagitis 07/23/2018  . Primary osteoarthritis of both hands 02/05/2018  . History of partial knee replacement 02/05/2018  . Anxiety and depression 02/05/2018  . History of kidney stones 02/05/2018  . High risk medication use 02/05/2018  . Seronegative Inflammatory arthritis 01/15/2017  . Primary osteoarthritis involving multiple joints 08/22/2016  . Carpal tunnel syndrome of left wrist 04/13/2016  . Chronic recurrent major depressive disorder (HCC) 11/02/2015  . Malaise and fatigue 11/02/2015  . Seronegative rheumatoid arthritis (HCC) 11/02/2015    Current Outpatient Medications on File Prior to Visit  Medication Sig Dispense Refill  . amoxicillin (AMOXIL) 500 MG tablet DENTAL PROCEDURES  0  . buPROPion (WELLBUTRIN XL) 150 MG 24 hr tablet Take 450 mg by mouth.    . celecoxib (CELEBREX) 200 MG capsule TAKE 1 CAPSULE DAILY    . CIALIS 5 MG tablet Take 2.5 mg by mouth every other day.  5  . CINNAMON PO Take by mouth daily.    Marland Kitchen CRANBERRY PO Take by mouth daily.    . eszopiclone (LUNESTA) 2 MG TABS tablet Take one by mouth at night prn insomnia.  Refills after prescription expiration require follow-up evaluation at the office.    . folic acid (FOLVITE) 1 MG tablet Take 2 tablets (2 mg total) by mouth daily. 180 tablet 3  . methotrexate 50 MG/2ML injection INJECT 0.8 MILLILITERS INTO THE SKIN ONCE A WEEK 10 mL 0  . Omega-3 Fatty Acids (FISH OIL PO) Take 1,250 mg by mouth daily.     . pantoprazole (PROTONIX) 40 MG tablet Take 40 mg by mouth daily.     Marland Kitchen sulfaSALAzine  (AZULFIDINE) 500 MG EC tablet Take 2 tablets (1,000 mg total) by mouth 2 (two) times daily. 360 tablet 0  . Tuberculin-Allergy Syringes 27G X 1/2" 1 ML MISC Use 1 syringe once weekly to inject methotrexate. 12 each 3  . TURMERIC PO Take by mouth daily.    Marland Kitchen VITAMIN E PO Take 400 mg by mouth daily.      No current facility-administered medications on file prior to visit.    Allergies  Allergen Reactions  . Nicotine     Objective: Physical Exam General: The patient is alert and oriented x3 in no acute distress.  Dermatology: Skin is warm, dry and supple bilateral lower extremities. Nails 1-10 are short and thick with positive fungus noted from previous culture. There is no erythema, edema, no eccymosis, no open lesions present. Integument is otherwise unremarkable.  Vascular: Dorsalis Pedis pulse and Posterior Tibial pulse are 2/4 bilateral. Capillary fill time is immediate to all digits.  Neurological: Grossly intact to light touch bilateral.  Musculoskeletal: Tenderness to palpation at the medial calcaneal tubercale and through the insertion of the plantar fascia on the left foot. There's pain also to peroneal tendon course. No pain with compression of calcaneus bilateral. No pain with calf compression bilateral. There is decreased Ankle joint range of motion bilateral. All other joints range of motion within normal limits bilateral. Strength 5/5 in all groups bilateral.   Gait: Unassisted, Antalgic  avoid weight on left heel  Xray, Left foot:  Normal osseous mineralization. Joint spaces preserved. No fracture/dislocation/boney destruction. Calcaneal spur present with mild thickening of plantar fascia. No other soft tissue abnormalities or radiopaque foreign bodies.   Assessment and Plan: Problem List Items Addressed This Visit   None   Visit Diagnoses    Plantar fasciitis, left    -  Primary   Pain of left heel       Tendonitis       Acquired equinus deformity of left foot           -Complete examination performed.  -Xrays reviewed -Discussed with patient in detail the condition of plantar fasciitis, how this occurs and general treatment options. Explained both conservative and surgical treatments.  -After oral consent and aseptic prep, injected a mixture containing 1 ml of 2%  plain lidocaine, 1 ml 0.5% plain marcaine, 0.5 ml of kenalog 10 and 0.5 ml of dexamethasone phosphate into left heel. Post-injection care discussed with patient.  -Recommended good supportive shoes  -Continue with PF/Arch support brace patient already has -Explained and dispensed to patient daily stretching exercises. -Recommend patient to ice affected area 1-2x daily. -Patient to return to office in 4 weeks for follow up or sooner if problems or questions arise. -Patient also reports that he desires to try laser treatment for his nail fungus we will arrange an appointment in [facility] for this issue.  Asencion Islam, DPM

## 2020-10-18 ENCOUNTER — Other Ambulatory Visit: Payer: MEDICARE

## 2020-10-18 ENCOUNTER — Other Ambulatory Visit: Payer: Self-pay

## 2020-10-18 ENCOUNTER — Ambulatory Visit (INDEPENDENT_AMBULATORY_CARE_PROVIDER_SITE_OTHER): Payer: MEDICARE | Admitting: *Deleted

## 2020-10-18 DIAGNOSIS — B351 Tinea unguium: Secondary | ICD-10-CM

## 2020-10-18 DIAGNOSIS — Z79899 Other long term (current) drug therapy: Secondary | ICD-10-CM

## 2020-10-18 NOTE — Progress Notes (Signed)
Patient presents today for the 1st laser treatment. Diagnosed with mycotic nail infection by Dr. Marylene Land.   Toenail most affected all ten nails.  All other systems are negative.  Nails were filed thin. Laser therapy was administered to 1-5 toenails bilateral and patient tolerated the treatment well. All safety precautions were in place.    Follow up in 4 weeks for laser # 2.  Picture of nails taken today to document visual progress

## 2020-10-18 NOTE — Patient Instructions (Signed)

## 2020-10-19 LAB — CBC WITH DIFFERENTIAL/PLATELET
Absolute Monocytes: 690 cells/uL (ref 200–950)
Basophils Absolute: 67 cells/uL (ref 0–200)
Basophils Relative: 1 %
Eosinophils Absolute: 141 cells/uL (ref 15–500)
Eosinophils Relative: 2.1 %
HCT: 42 % (ref 38.5–50.0)
Hemoglobin: 14.2 g/dL (ref 13.2–17.1)
Lymphs Abs: 2057 cells/uL (ref 850–3900)
MCH: 32.3 pg (ref 27.0–33.0)
MCHC: 33.8 g/dL (ref 32.0–36.0)
MCV: 95.7 fL (ref 80.0–100.0)
MPV: 10.2 fL (ref 7.5–12.5)
Monocytes Relative: 10.3 %
Neutro Abs: 3745 cells/uL (ref 1500–7800)
Neutrophils Relative %: 55.9 %
Platelets: 224 10*3/uL (ref 140–400)
RBC: 4.39 10*6/uL (ref 4.20–5.80)
RDW: 13.1 % (ref 11.0–15.0)
Total Lymphocyte: 30.7 %
WBC: 6.7 10*3/uL (ref 3.8–10.8)

## 2020-10-19 LAB — COMPLETE METABOLIC PANEL WITH GFR
AG Ratio: 2 (calc) (ref 1.0–2.5)
ALT: 14 U/L (ref 9–46)
AST: 14 U/L (ref 10–35)
Albumin: 4.3 g/dL (ref 3.6–5.1)
Alkaline phosphatase (APISO): 52 U/L (ref 35–144)
BUN: 20 mg/dL (ref 7–25)
CO2: 27 mmol/L (ref 20–32)
Calcium: 9.1 mg/dL (ref 8.6–10.3)
Chloride: 105 mmol/L (ref 98–110)
Creat: 0.96 mg/dL (ref 0.70–1.25)
GFR, Est African American: 95 mL/min/{1.73_m2} (ref >=60)
GFR, Est Non African American: 82 mL/min/{1.73_m2} (ref >=60)
Globulin: 2.1 g/dL (calc) (ref 1.9–3.7)
Glucose, Bld: 95 mg/dL (ref 65–99)
Potassium: 4 mmol/L (ref 3.5–5.3)
Sodium: 141 mmol/L (ref 135–146)
Total Bilirubin: 0.5 mg/dL (ref 0.2–1.2)
Total Protein: 6.4 g/dL (ref 6.1–8.1)

## 2020-10-19 NOTE — Progress Notes (Signed)
CBC and CMP within normal limits.

## 2020-11-15 ENCOUNTER — Other Ambulatory Visit: Payer: Self-pay

## 2020-11-15 ENCOUNTER — Ambulatory Visit (INDEPENDENT_AMBULATORY_CARE_PROVIDER_SITE_OTHER): Payer: MEDICARE

## 2020-11-15 DIAGNOSIS — B351 Tinea unguium: Secondary | ICD-10-CM

## 2020-11-15 NOTE — Progress Notes (Signed)
Patient presents today for the 2nd laser treatment. Diagnosed with mycotic nail infection by Dr. Stover.   Toenail most affected all ten nails .  All other systems are negative.  Nails were filed thin. Laser therapy was administered to 1-5 toenails bilateral and patient tolerated the treatment well. All safety precautions were in place.    Follow up in 4 weeks for laser # 3.   

## 2020-11-17 ENCOUNTER — Encounter: Payer: Self-pay | Admitting: Sports Medicine

## 2020-11-17 ENCOUNTER — Other Ambulatory Visit: Payer: Self-pay

## 2020-11-17 ENCOUNTER — Ambulatory Visit (INDEPENDENT_AMBULATORY_CARE_PROVIDER_SITE_OTHER): Payer: MEDICARE | Admitting: Sports Medicine

## 2020-11-17 DIAGNOSIS — M79672 Pain in left foot: Secondary | ICD-10-CM | POA: Diagnosis not present

## 2020-11-17 DIAGNOSIS — M216X2 Other acquired deformities of left foot: Secondary | ICD-10-CM

## 2020-11-17 DIAGNOSIS — B351 Tinea unguium: Secondary | ICD-10-CM

## 2020-11-17 DIAGNOSIS — M779 Enthesopathy, unspecified: Secondary | ICD-10-CM | POA: Diagnosis not present

## 2020-11-17 DIAGNOSIS — M722 Plantar fascial fibromatosis: Secondary | ICD-10-CM

## 2020-11-17 NOTE — Progress Notes (Signed)
Subjective: [name] is a 65 y.o. male returns to office for follow up evaluation after Left/Right heel injection for plantar fasciitis, injection #1 administered 5 weeks ago. Patient states that the injection seems to help his pain; pain is now much less only a small amount of tenderness that actually comes more so when he is wearing his brace or arch support. Patient denies any recent changes in medications or new problems since last visit.  Reports that he also went for laser treatment on Monday and had 2 treatments so far for his nail fungus and things seem to be doing better.  Patient Active Problem List   Diagnosis Date Noted   Mixed hyperlipidemia 06/25/2020   GERD without esophagitis 07/23/2018   Primary osteoarthritis of both hands 02/05/2018   History of partial knee replacement 02/05/2018   Anxiety and depression 02/05/2018   History of kidney stones 02/05/2018   High risk medication use 02/05/2018   Seronegative Inflammatory arthritis 01/15/2017   Primary osteoarthritis involving multiple joints 08/22/2016   Carpal tunnel syndrome of left wrist 04/13/2016   Chronic recurrent major depressive disorder (HCC) 11/02/2015   Malaise and fatigue 11/02/2015   Seronegative rheumatoid arthritis (HCC) 11/02/2015    Current Outpatient Medications on File Prior to Visit  Medication Sig Dispense Refill   amoxicillin (AMOXIL) 500 MG tablet DENTAL PROCEDURES  0   buPROPion (WELLBUTRIN XL) 150 MG 24 hr tablet Take 450 mg by mouth.     celecoxib (CELEBREX) 200 MG capsule TAKE 1 CAPSULE DAILY     CIALIS 5 MG tablet Take 2.5 mg by mouth every other day.  5   CINNAMON PO Take by mouth daily.     CRANBERRY PO Take by mouth daily.     eszopiclone (LUNESTA) 2 MG TABS tablet Take one by mouth at night prn insomnia.  Refills after prescription expiration require follow-up evaluation at the office.     folic acid (FOLVITE) 1 MG tablet Take 2 tablets (2 mg total) by mouth daily. 180 tablet 3    methotrexate 50 MG/2ML injection INJECT 0.8 MILLILITERS INTO THE SKIN ONCE A WEEK 10 mL 0   Omega-3 Fatty Acids (FISH OIL PO) Take 1,250 mg by mouth daily.      pantoprazole (PROTONIX) 40 MG tablet Take 40 mg by mouth daily.      sulfaSALAzine (AZULFIDINE) 500 MG EC tablet Take 2 tablets (1,000 mg total) by mouth 2 (two) times daily. 360 tablet 0   Tuberculin-Allergy Syringes 27G X 1/2" 1 ML MISC Use 1 syringe once weekly to inject methotrexate. 12 each 3   TURMERIC PO Take by mouth daily.     VITAMIN E PO Take 400 mg by mouth daily.      No current facility-administered medications on file prior to visit.    Allergies  Allergen Reactions   Nicotine     Objective:   General: The patient is alert and oriented x3 in no acute distress.   Dermatology: Skin is warm, dry and supple bilateral lower extremities. Nails 1-10 are short and thick with positive fungus noted from previous culture undergoing laser treatment with proximal clearance noted. There is no erythema, edema, no eccymosis, no open lesions present. Integument is otherwise unremarkable.   Vascular: Dorsalis Pedis pulse and Posterior Tibial pulse are 2/4 bilateral. Capillary fill time is immediate to all digits.   Neurological: Grossly intact to light touch bilateral.   Musculoskeletal: Minimal tenderness to palpation at the medial calcaneal tubercale and through  the insertion of the plantar fascia on the left foot. There's no reproducible pain to peroneal tendon course on the left. No pain with compression of calcaneus bilateral. No pain with calf compression bilateral. There is decreased Ankle joint range of motion bilateral. All other joints range of motion within normal limits bilateral. Strength 5/5 in all groups bilateral.   Assessment and Plan: Problem List Items Addressed This Visit   None Visit Diagnoses     Plantar fasciitis, left    -  Primary   Pain of left heel       Tendonitis       Acquired equinus deformity  of left foot       Nail fungus           -Complete examination performed.  -Continue with laser treatment as scheduled for nail fungus -Re-Discussed with patient in detail the condition of plantar fasciitis, how this  occurs related to the foot type of the patient and general treatment options. -Since pain is much improved no injection given at this visit -Advised patient to slowly wean from his fascial brace -Continue with good supportive shoes daily for foot type -Continue with stretching, and icing until soreness has resolved -Discussed long term care and reocurrence; will closely monitor; if fails to improve will consider other treatment modalities.  -Patient to return to office as scheduled for laser treatment or sooner if heel pain fails to improve.  Asencion Islam, DPM

## 2020-12-09 NOTE — Progress Notes (Signed)
Office Visit Note  Patient: [name]             Date of Birth: July 13, 1955           MRN: [id number]             PCP: Gordan Payment., MD Referring: Gordan Payment., MD Visit Date: 12/23/2020 Occupation: @GUAROCC @  Subjective:  Medication management.   History of Present Illness: [name] is a 65 y.o. male with a history of seronegative inflammatory arthritis.  He states he has been doing well on the combination of sulfasalazine and methotrexate.  He denies any joint pain or joint swelling.  He states his bilateral replaced knee joints are doing well.  Has been tolerating medications without any side effects.  Activities of Daily Living:  Patient reports morning stiffness for 0 minutes.   Patient Denies nocturnal pain.  Difficulty dressing/grooming: Denies Difficulty climbing stairs: Denies Difficulty getting out of chair: Reports Difficulty using hands for taps, buttons, cutlery, and/or writing: Reports  Review of Systems  Constitutional:  Negative for fatigue.  HENT:  Negative for mouth sores, mouth dryness and nose dryness.   Eyes:  Negative for pain, itching, visual disturbance and dryness.  Respiratory:  Negative for cough, hemoptysis, shortness of breath and difficulty breathing.   Cardiovascular:  Negative for chest pain, palpitations and swelling in legs/feet.  Gastrointestinal:  Negative for abdominal pain, blood in stool, constipation and diarrhea.  Endocrine: Negative for increased urination.  Genitourinary:  Negative for painful urination.  Musculoskeletal:  Negative for joint pain, joint pain, joint swelling, myalgias, muscle weakness, morning stiffness, muscle tenderness and myalgias.  Skin:  Negative for color change, rash and redness.  Allergic/Immunologic: Negative for susceptible to infections.  Neurological:  Positive for light-headedness. Negative for dizziness, numbness, headaches, memory loss and weakness.  Hematological:  Negative for swollen  glands.  Psychiatric/Behavioral:  Positive for sleep disturbance. Negative for confusion.    PMFS History:  Patient Active Problem List   Diagnosis Date Noted   Mixed hyperlipidemia 06/25/2020   GERD without esophagitis 07/23/2018   Primary osteoarthritis of both hands 02/05/2018   History of partial knee replacement 02/05/2018   Anxiety and depression 02/05/2018   History of kidney stones 02/05/2018   High risk medication use 02/05/2018   Seronegative Inflammatory arthritis 01/15/2017   Primary osteoarthritis involving multiple joints 08/22/2016   Carpal tunnel syndrome of left wrist 04/13/2016   Chronic recurrent major depressive disorder (HCC) 11/02/2015   Malaise and fatigue 11/02/2015   Seronegative rheumatoid arthritis (HCC) 11/02/2015    Past Medical History:  Diagnosis Date   Arthritis    Kidney stone     Family History  Problem Relation Age of Onset   Cancer Mother        breast   Past Surgical History:  Procedure Laterality Date   ANKLE ARTHROPLASTY Right    1989 right ankle reconstruction   CARPAL TUNNEL RELEASE Bilateral    JOINT REPLACEMENT     right knee partial    JOINT REPLACEMENT     left knee partial   KNEE ARTHROPLASTY     KNEE ARTHROSCOPY     TOTAL SHOULDER ARTHROPLASTY     Social History   Social History Narrative   Not on file   Immunization History  Administered Date(s) Administered   11/04/2015 (J&J) SARS-COV-2 Vaccination 10/15/2019   Moderna Sars-Covid-2 Vaccination 05/12/2020   Pneumococcal Conjugate-13 08/28/2016   Pneumococcal Polysaccharide-23 02/26/2017   Td 05/08/2005  Zoster Recombinat (Shingrix) 08/02/2017, 11/30/2017     Objective: Vital Signs: BP 135/86 (BP Location: Left Arm, Patient Position: Sitting, Cuff Size: Normal)   Pulse 76   Ht 5\' 10"  (1.778 m)   Wt 186 lb 3.2 oz (84.5 kg)   BMI 26.72 kg/m    Physical Exam Vitals and nursing note reviewed.  Constitutional:      Appearance: He is well-developed.  HENT:      Head: Normocephalic and atraumatic.  Eyes:     Conjunctiva/sclera: Conjunctivae normal.     Pupils: Pupils are equal, round, and reactive to light.  Cardiovascular:     Rate and Rhythm: Normal rate and regular rhythm.     Heart sounds: Normal heart sounds.  Pulmonary:     Effort: Pulmonary effort is normal.     Breath sounds: Normal breath sounds.  Abdominal:     General: Bowel sounds are normal.     Palpations: Abdomen is soft.  Musculoskeletal:     Cervical back: Normal range of motion and neck supple.  Skin:    General: Skin is warm and dry.     Capillary Refill: Capillary refill takes less than 2 seconds.  Neurological:     Mental Status: He is alert and oriented to person, place, and time.  Psychiatric:        Behavior: Behavior normal.     Musculoskeletal Exam: C-spine was in good range of motion.  Shoulder joints, elbow joints, wrist joints, MCPs PIPs and DIPs with good range of motion with no synovitis.  Hip joints, knee joints, ankles, MTPs and PIPs with good range of motion with no synovitis.  His bilateral knee joints are partially replaced which are doing well.  CDAI Exam: CDAI Score: 0  Patient Global: 0 mm; Provider Global: 0 mm Swollen: 0 ; Tender: 0  Joint Exam 12/23/2020   No joint exam has been documented for this visit   There is currently no information documented on the homunculus. Go to the Rheumatology activity and complete the homunculus joint exam.  Investigation: No additional findings.  Imaging: No results found.  Recent Labs: Lab Results  Component Value Date   WBC 6.7 10/18/2020   HGB 14.2 10/18/2020   PLT 224 10/18/2020   NA 141 10/18/2020   K 4.0 10/18/2020   CL 105 10/18/2020   CO2 27 10/18/2020   GLUCOSE 95 10/18/2020   BUN 20 10/18/2020   CREATININE 0.96 10/18/2020   BILITOT 0.5 10/18/2020   AST 14 10/18/2020   ALT 14 10/18/2020   PROT 6.4 10/18/2020   CALCIUM 9.1 10/18/2020   GFRAA 95 10/18/2020   QFTBGOLDPLUS NEGATIVE  06/06/2017    Speciality Comments: No specialty comments available.  Procedures:  No procedures performed Allergies: Nicotine   Assessment / Plan:     Visit Diagnoses: Seronegative Inflammatory arthritis-patient had no synovitis on examination today.  Rating medications well.  High risk medication use - Methotrexate 0.8 ml sq injections once weekly, folic acid 2 mg by mouth daily, and sulfasalazine 500 mg 2 tables by mouth twice daily.  His labs from October 18, 2020 were normal.  Left findings were discussed.  He will get labs in September and then every 3 months to monitor for drug toxicity.  He has been advised to stop methotrexate and sulfasalazine in case he develops an infection and restart after the infection resolves.  Information regarding realization was also placed in the AVS.  Primary osteoarthritis of both hands-  History of  partial knee replacement - Left-2015 and right-2017: He had good results from the partial knee replacement.  He denies any discomfort.  Anxiety and depression-doing well.  History of kidney stones-he has not had any recurrence in many years.  Orders: No orders of the defined types were placed in this encounter.  No orders of the defined types were placed in this encounter.    Follow-Up Instructions: Return in about 5 months (around 05/25/2021) for inflammatory arthritis.   [name], MD  Note - This record has been created using Animal nutritionist.  Chart creation errors have been sought, but may not always  have been located. Such creation errors do not reflect on  the standard of medical care.

## 2020-12-13 ENCOUNTER — Other Ambulatory Visit: Payer: Self-pay

## 2020-12-13 ENCOUNTER — Ambulatory Visit (INDEPENDENT_AMBULATORY_CARE_PROVIDER_SITE_OTHER): Payer: MEDICARE | Admitting: *Deleted

## 2020-12-13 DIAGNOSIS — B351 Tinea unguium: Secondary | ICD-10-CM

## 2020-12-13 NOTE — Progress Notes (Signed)
Patient presents today for the 3rd laser treatment. Diagnosed with mycotic nail infection by Dr. Marylene Land.   Toenail most affected all ten nails.  All other systems are negative.  Nails were filed thin. Laser therapy was administered to 1-5 toenails bilateral and patient tolerated the treatment well. All safety precautions were in place.    Follow up in 6 weeks for laser # 4

## 2020-12-23 ENCOUNTER — Encounter: Payer: Self-pay | Admitting: Rheumatology

## 2020-12-23 ENCOUNTER — Other Ambulatory Visit: Payer: Self-pay

## 2020-12-23 ENCOUNTER — Ambulatory Visit (INDEPENDENT_AMBULATORY_CARE_PROVIDER_SITE_OTHER): Payer: MEDICARE | Admitting: Rheumatology

## 2020-12-23 VITALS — BP 135/86 | HR 76 | Ht 70.0 in | Wt 186.2 lb

## 2020-12-23 DIAGNOSIS — M19042 Primary osteoarthritis, left hand: Secondary | ICD-10-CM

## 2020-12-23 DIAGNOSIS — M1990 Unspecified osteoarthritis, unspecified site: Secondary | ICD-10-CM | POA: Diagnosis not present

## 2020-12-23 DIAGNOSIS — M19041 Primary osteoarthritis, right hand: Secondary | ICD-10-CM | POA: Diagnosis not present

## 2020-12-23 DIAGNOSIS — Z87442 Personal history of urinary calculi: Secondary | ICD-10-CM

## 2020-12-23 DIAGNOSIS — Z96659 Presence of unspecified artificial knee joint: Secondary | ICD-10-CM

## 2020-12-23 DIAGNOSIS — G5602 Carpal tunnel syndrome, left upper limb: Secondary | ICD-10-CM

## 2020-12-23 DIAGNOSIS — F419 Anxiety disorder, unspecified: Secondary | ICD-10-CM

## 2020-12-23 DIAGNOSIS — Z79899 Other long term (current) drug therapy: Secondary | ICD-10-CM | POA: Diagnosis not present

## 2020-12-23 DIAGNOSIS — F32A Depression, unspecified: Secondary | ICD-10-CM

## 2020-12-23 NOTE — Patient Instructions (Signed)
Standing Labs We placed an order today for your standing lab work.   Please have your standing labs drawn in September and every 3 months  If possible, please have your labs drawn 2 weeks prior to your appointment so that the provider can discuss your results at your appointment.  Please note that you may see your imaging and lab results in MyChart before we have reviewed them. We may be awaiting multiple results to interpret others before contacting you. Please allow our office up to 72 hours to thoroughly review all of the results before contacting the office for clarification of your results.  We have open lab daily: Monday through Thursday from 1:30-4:30 PM and Friday from 1:30-4:00 PM at the office of Dr. [name], [facility].   Please be advised, all patients with office appointments requiring lab work will take precedent over walk-in lab work.  If possible, please come for your lab work on Monday and Friday afternoons, as you may experience shorter wait times. The office is located at [address], [address], [place], [place] [address] No appointment is necessary.   Labs are drawn by [facility]. Please bring your co-pay at the time of your lab draw.  You may receive a bill from [facility] for your lab work.  If you wish to have your labs drawn at another location, please call the office 24 hours in advance to send orders.  If you have any questions regarding directions or hours of operation,  please call [phone].   As a reminder, please drink plenty of water prior to coming for your lab work. Thanks!   Vaccines You are taking a medication(s) that can suppress your immune system.  The following immunizations are recommended: Flu annually Covid-19  Td/Tdap (tetanus, diphtheria, pertussis) every 10 years Pneumonia (Prevnar 15 then Pneumovax 23 at least 1 year apart.  Alternatively, can take Prevnar 20 without needing additional dose) Shingrix (after age 56): 2  doses from 4 weeks to 6 months apart  Please check with your PCP to make sure you are up to date.   If you test POSITIVE for COVID19 and have MILD to MODERATE symptoms: First, call your PCP if you would like to receive COVID19 treatment AND Hold your medications during the infection and for at least 1 week after your symptoms have resolved: Injectable medication (Benlysta, Cimzia, Cosentyx, Enbrel, Humira, Orencia, Remicade, Simponi, Stelara, Taltz, Tremfya) Methotrexate Leflunomide (Arava) Azathioprine Mycophenolate (Cellcept) Osborne Oman, or Rinvoq Otezla If you take Actemra or Kevzara, you DO NOT need to hold these for COVID19 infection. If you have signs or symptoms of an infection or start antibiotics: First, call your PCP for workup of your infection. Hold your medication through the infection, until you complete your antibiotics, and until symptoms resolve if you take the following: Injectable medication (Actemra, Benlysta, Cimzia, Cosentyx, Enbrel, Humira, Kevzara, Orencia, Remicade, Simponi, Stelara, Taltz, Tremfya) Methotrexate Leflunomide (Arava) Mycophenolate (Cellcept) Harriette Ohara, Olumiant, or Rinvoq  COVID-19 vaccine recommendations:   COVID-19 vaccine is recommended for everyone (unless you are allergic to a vaccine component), even if you are on a medication that suppresses your immune system.   If you are on Methotrexate, Cellcept (mycophenolate), Rinvoq, Harriette Ohara, and Olumiant- hold the medication for 1 week after each vaccine. Hold Methotrexate for 2 weeks after the single dose COVID-19 vaccine.   If you are on Orencia subcutaneous injection - hold medication one week prior to and one week after the first COVID-19 vaccine dose (only).  If you are on Orencia IV infusions- time vaccination administration so that the first COVID-19 vaccination will occur four weeks after the infusion and postpone the subsequent infusion by one week.   If you are on Cyclophosphamide  or Rituxan infusions please contact your doctor prior to receiving the COVID-19 vaccine.   Do not take Tylenol or any anti-inflammatory medications (NSAIDs) 24 hours prior to the COVID-19 vaccination.   There is no direct evidence about the efficacy of the COVID-19 vaccine in individuals who are on medications that suppress the immune system.   Even if you are fully vaccinated, and you are on any medications that suppress your immune system, please continue to wear a mask, maintain at least six feet social distance and practice hand hygiene.   If you develop a COVID-19 infection, please contact your PCP or our office to determine if you need monoclonal antibody infusion.  The booster vaccine is now available for immunocompromised patients.   Please see the following web sites for updated information.   [email or web address]-Patient-[email or web address]  Heart Disease Prevention   Your inflammatory disease increases your risk of heart disease which includes heart attack, stroke, atrial fibrillation (irregular heartbeats), high blood pressure, heart failure and atherosclerosis (plaque in the arteries).  It is important to reduce your risk by:   Keep blood pressure, cholesterol, and blood sugar at healthy levels   Smoking Cessation   Maintain a healthy weight  BMI 20-25   Eat a healthy diet  Plenty of fresh fruit, vegetables, and whole grains  Limit saturated fats, foods high in sodium, and added sugars  DASH and Mediterranean diet   Increase physical activity  Recommend moderate physically activity for 150 minutes per week/ 30 minutes a day for five days a week These can be broken up into three separate ten-minute sessions during the day.   Reduce Stress  Meditation, slow breathing exercises, yoga, coloring books  Dental visits twice a year

## 2021-02-04 ENCOUNTER — Ambulatory Visit (INDEPENDENT_AMBULATORY_CARE_PROVIDER_SITE_OTHER): Payer: Self-pay | Admitting: *Deleted

## 2021-02-04 ENCOUNTER — Other Ambulatory Visit: Payer: Self-pay

## 2021-02-04 DIAGNOSIS — B351 Tinea unguium: Secondary | ICD-10-CM

## 2021-02-04 NOTE — Progress Notes (Signed)
Patient presents today for the 4th laser treatment. Diagnosed with mycotic nail infection by Dr. Stover.   Toenail most affected all ten nails.  All other systems are negative.  Nails were filed thin. Laser therapy was administered to 1-5 toenails bilateral and patient tolerated the treatment well. All safety precautions were in place.    Follow up in 6 weeks for laser # 5    

## 2021-03-18 ENCOUNTER — Other Ambulatory Visit: Payer: Self-pay

## 2021-03-18 ENCOUNTER — Ambulatory Visit (INDEPENDENT_AMBULATORY_CARE_PROVIDER_SITE_OTHER): Payer: MEDICARE | Admitting: *Deleted

## 2021-03-18 DIAGNOSIS — B351 Tinea unguium: Secondary | ICD-10-CM

## 2021-03-18 NOTE — Progress Notes (Signed)
Patient presents today for the 5th laser treatment. Diagnosed with mycotic nail infection by Dr. Marylene Land.   Toenail most affected all ten nails. They are slightly improving.  All other systems are negative.  Nails were filed thin. Laser therapy was administered to 1-5 toenails bilateral and patient tolerated the treatment well. All safety precautions were in place.    Follow up in 8 weeks for laser # 6

## 2021-05-13 ENCOUNTER — Other Ambulatory Visit: Payer: Self-pay

## 2021-05-13 ENCOUNTER — Ambulatory Visit (INDEPENDENT_AMBULATORY_CARE_PROVIDER_SITE_OTHER): Payer: Self-pay | Admitting: *Deleted

## 2021-05-13 DIAGNOSIS — B351 Tinea unguium: Secondary | ICD-10-CM

## 2021-05-13 NOTE — Progress Notes (Signed)
Patient presents today for the 6th laser treatment. Diagnosed with mycotic nail infection by Dr. Marylene Land.   Toenail most affected all ten nails. Some of the nails are showing new nail growth at the base.  All other systems are negative.  Nails were filed thin. Laser therapy was administered to 1-5 toenails bilateral and patient tolerated the treatment well. All safety precautions were in place.    Patient has completed the recommended laser treatments. He will follow up with Dr. Marylene Land in 3 months to evaluate progress.   Picture of nails taken today to document visual progress

## 2021-05-13 NOTE — Progress Notes (Signed)
Office Visit Note  Patient: [name]             Date of Birth: [birth year]           MRN: [id number]             PCP: Andrew Mina., MD Referring: Andrew Mina., MD Visit Date: 05/27/2021 Occupation: @GUAROCC @  Subjective:  Medication monitoring   History of Present Illness: [name] is a 66 y.o. male with history of seronegative inflammatory arthritis and osteoarthritis.  He is taking methotrexate 0.8 mL sq injections once weekly, folic acid 2 mg daily, and sulfasalazine 500 mg 2 tablets by mouth twice daily.  He is tolerating these medications without any side effects and has not missed any doses recently.  He denies any signs or symptoms of a rheumatoid arthritis flare.  He experiences some joint stiffness and intermittent sharp pains in his hands but has not noticed any inflammation.  He remains active. He had a recent ear infection in which time he was prescribed Augmentin which has resolved his symptoms.   Activities of Daily Living:  Patient reports morning stiffness for a few minutes.   Patient Denies nocturnal pain.  Difficulty dressing/grooming: Reports Difficulty climbing stairs: Denies Difficulty getting out of chair: Reports Difficulty using hands for taps, buttons, cutlery, and/or writing: Denies  Review of Systems  Constitutional:  Negative for fatigue and night sweats.  HENT:  Positive for ear pain. Negative for mouth sores, mouth dryness and nose dryness.   Eyes:  Negative for redness and dryness.  Respiratory:  Negative for shortness of breath and difficulty breathing.   Cardiovascular:  Negative for chest pain, palpitations, hypertension, irregular heartbeat and swelling in legs/feet.  Gastrointestinal:  Negative for constipation and diarrhea.  Endocrine: Negative for increased urination.  Genitourinary:  Negative for painful urination.  Musculoskeletal:  Positive for joint pain and joint pain. Negative for joint swelling, myalgias, muscle  weakness, morning stiffness, muscle tenderness and myalgias.  Skin:  Negative for color change, rash, hair loss, nodules/bumps, skin tightness, ulcers and sensitivity to sunlight.  Allergic/Immunologic: Negative for susceptible to infections.  Neurological: Negative.  Negative for dizziness, fainting, memory loss, night sweats and weakness.  Hematological:  Positive for bruising/bleeding tendency. Negative for swollen glands.  Psychiatric/Behavioral: Negative.  Negative for depressed mood and sleep disturbance. The patient is not nervous/anxious.    PMFS History:  Patient Active Problem List   Diagnosis Date Noted   Mixed hyperlipidemia 06/25/2020   GERD without esophagitis 07/23/2018   Primary osteoarthritis of both hands 02/05/2018   History of partial knee replacement 02/05/2018   Anxiety and depression 02/05/2018   History of kidney stones 02/05/2018   High risk medication use 02/05/2018   Seronegative Inflammatory arthritis 01/15/2017   Primary osteoarthritis involving multiple joints 08/22/2016   Carpal tunnel syndrome of left wrist 04/13/2016   Chronic recurrent major depressive disorder ([place]) 11/02/2015   Malaise and fatigue 11/02/2015   Seronegative rheumatoid arthritis ([place]) 11/02/2015    Past Medical History:  Diagnosis Date   Arthritis    Kidney stone     Family History  Problem Relation Age of Onset   Cancer Mother        breast   Past Surgical History:  Procedure Laterality Date   ANKLE ARTHROPLASTY Right    1989 right ankle reconstruction   CARPAL TUNNEL RELEASE Bilateral    JOINT REPLACEMENT     right knee partial    JOINT REPLACEMENT  left knee partial   KNEE ARTHROPLASTY     KNEE ARTHROSCOPY     TOTAL SHOULDER ARTHROPLASTY     Social History   Social History Narrative   Not on file   Immunization History  Administered Date(s) Administered   Janssen (J&J) SARS-COV-2 Vaccination 10/15/2019   Moderna Sars-Covid-2 Vaccination 05/12/2020    Pneumococcal Conjugate-13 08/28/2016   Pneumococcal Polysaccharide-23 02/26/2017   Td 05/08/2005   Zoster Recombinat (Shingrix) 08/02/2017, 11/30/2017     Objective: Vital Signs: BP (!) 146/83    Pulse 71    Ht 5\' 10"  (1.778 m)    Wt 190 lb (86.2 kg)    BMI 27.26 kg/m    Physical Exam Vitals and nursing note reviewed.  Constitutional:      Appearance: He is well-developed.  HENT:     Head: Normocephalic and atraumatic.  Eyes:     Conjunctiva/sclera: Conjunctivae normal.     Pupils: Pupils are equal, round, and reactive to light.  Pulmonary:     Effort: Pulmonary effort is normal.  Abdominal:     Palpations: Abdomen is soft.  Musculoskeletal:     Cervical back: Normal range of motion and neck supple.  Skin:    General: Skin is warm and dry.     Capillary Refill: Capillary refill takes less than 2 seconds.  Neurological:     Mental Status: He is alert and oriented to person, place, and time.  Psychiatric:        Behavior: Behavior normal.     Musculoskeletal Exam: C-spine, thoracic spine, lumbar spine have good range of motion with no discomfort.  Shoulder joints, elbow joints, wrist joints, MCPs, PIPs, DIPs have good range of motion with no synovitis.  PIP and DIP thickening consistent with osteoarthritis of both hands.  Complete fist formation bilaterally.  Hip joints have good range of motion with no groin pain.  Knee joints have good range of motion with no warmth or effusion.  Ankle joints have good range of motion with no tenderness or joint swelling.  CDAI Exam: CDAI Score: 0.6  Patient Global: 3 mm; Provider Global: 3 mm Swollen: 0 ; Tender: 0  Joint Exam 05/27/2021   No joint exam has been documented for this visit   There is currently no information documented on the homunculus. Go to the Rheumatology activity and complete the homunculus joint exam.  Investigation: No additional findings.  Imaging: No results found.  Recent Labs: Lab Results  Component  Value Date   WBC 6.7 10/18/2020   HGB 14.2 10/18/2020   PLT 224 10/18/2020   NA 141 10/18/2020   K 4.0 10/18/2020   CL 105 10/18/2020   CO2 27 10/18/2020   GLUCOSE 95 10/18/2020   BUN 20 10/18/2020   CREATININE 0.96 10/18/2020   BILITOT 0.5 10/18/2020   AST 14 10/18/2020   ALT 14 10/18/2020   PROT 6.4 10/18/2020   CALCIUM 9.1 10/18/2020   GFRAA 95 10/18/2020   QFTBGOLDPLUS NEGATIVE 06/06/2017    Speciality Comments: No specialty comments available.  Procedures:  No procedures performed Allergies: Nicotine   Assessment / Plan:     Visit Diagnoses: Seronegative Inflammatory arthritis: He has no joint tenderness or synovitis on examination today.  He has not had any recent inflammatory arthritis flares.  He has clinically been doing well on combination therapy including methotrexate 0.8 mL sq injections once weekly and sulfasalazine 500 mg 2 tablets by mouth twice daily.  He continues to tolerate these medications without  any side effects.  He has occasional fleeting pains and stiffness in his hands but has not noticed any inflammation.  Discussed the importance of joint protection and muscle strengthening.  He was encouraged to remain active.  He will remain on the current treatment regimen.  Refills of methotrexate and sulfasalazine were sent to the pharmacy today.  He was advised to notify us if he develops increased joint pain or joint swelling.  He will follow-up in the office in 5 months.  High risk medication use: Methotrexate 0.8 mL sq injections once weekly, folic acid 2 mg daily, and sulfasalazine 500 mg 2 tablets by mouth twice daily. CBC and CMP were drawn on 12/23/2020.  He is overdue to update lab work.  Orders for CBC and CMP were released.  His next lab work will be due in April and every 3 months to monitor for drug toxicity.  Standing orders for CBC and CMP remain in place. Advised the patient to hold methotrexate and sulfasalazine if he develops signs or symptoms of an  infection and to resume once the infection is completely cleared.  Primary osteoarthritis of both hands: He has PIP and DIP thickening consistent with osteoarthritis of both hands.  No tenderness or inflammation was noted on examination today.  He was able to make a complete fist on exam.  Discussed the importance of joint protection and muscle strengthening.  He was given a handout of hand exercises to perform.  History of partial knee replacement: Left 2015 and right 2017: Doing well.  He has good range of motion of both partial knee replacements with no discomfort at this time.  Warmth but no effusion was noted.  Discussed the importance of lower extremity muscle strengthening.  Other medical conditions are listed as follows:   Anxiety and depression  History of kidney stones  Carpal tunnel syndrome of left wrist: Asymptomatic at this time.   Orders: Orders Placed This Encounter  Procedures   CBC with Differential/Platelet   COMPLETE METABOLIC PANEL WITH GFR   Meds ordered this encounter  Medications   sulfaSALAzine (AZULFIDINE) 500 MG EC tablet    Sig: Take 2 tablets (1,000 mg total) by mouth 2 (two) times daily.    Dispense:  360 tablet    Refill:  0   methotrexate 50 MG/2ML injection    Sig: INJECT 0.8 MILLILITERS INTO THE SKIN ONCE A WEEK    Dispense:  10 mL    Refill:  0     Follow-Up Instructions: Return in about 5 months (around 10/25/2021) for Rheumatoid arthritis, Osteoarthritis.   Ofilia Neas, PA-C  Note - This record has been created using Dragon software.  Chart creation errors have been sought, but may not always  have been located. Such creation errors do not reflect on  the standard of medical care.

## 2021-05-27 ENCOUNTER — Encounter: Payer: Self-pay | Admitting: Physician Assistant

## 2021-05-27 ENCOUNTER — Ambulatory Visit: Payer: Medicare Other | Admitting: Physician Assistant

## 2021-05-27 ENCOUNTER — Other Ambulatory Visit: Payer: Self-pay

## 2021-05-27 VITALS — BP 146/83 | HR 71 | Ht 70.0 in | Wt 190.0 lb

## 2021-05-27 DIAGNOSIS — F419 Anxiety disorder, unspecified: Secondary | ICD-10-CM

## 2021-05-27 DIAGNOSIS — Z96659 Presence of unspecified artificial knee joint: Secondary | ICD-10-CM

## 2021-05-27 DIAGNOSIS — Z87442 Personal history of urinary calculi: Secondary | ICD-10-CM

## 2021-05-27 DIAGNOSIS — Z79899 Other long term (current) drug therapy: Secondary | ICD-10-CM | POA: Diagnosis not present

## 2021-05-27 DIAGNOSIS — M19041 Primary osteoarthritis, right hand: Secondary | ICD-10-CM | POA: Diagnosis not present

## 2021-05-27 DIAGNOSIS — M19042 Primary osteoarthritis, left hand: Secondary | ICD-10-CM

## 2021-05-27 DIAGNOSIS — M1990 Unspecified osteoarthritis, unspecified site: Secondary | ICD-10-CM | POA: Diagnosis not present

## 2021-05-27 DIAGNOSIS — F32A Depression, unspecified: Secondary | ICD-10-CM

## 2021-05-27 DIAGNOSIS — G5602 Carpal tunnel syndrome, left upper limb: Secondary | ICD-10-CM

## 2021-05-27 MED ORDER — SULFASALAZINE 500 MG PO TBEC: 1000.0000 mg | DELAYED_RELEASE_TABLET | Freq: Two times a day (BID) | ORAL | 0 refills | Status: DC

## 2021-05-27 MED ORDER — METHOTREXATE SODIUM CHEMO INJECTION 50 MG/2ML: INTRAMUSCULAR | 0 refills | Status: DC

## 2021-05-27 NOTE — Patient Instructions (Addendum)
Standing Labs We placed an order today for your standing lab work.   Please have your standing labs drawn in April and every 3 months   If possible, please have your labs drawn 2 weeks prior to your appointment so that the provider can discuss your results at your appointment.  Please note that you may see your imaging and lab results in MyChart before we have reviewed them. We may be awaiting multiple results to interpret others before contacting you. Please allow our office up to 72 hours to thoroughly review all of the results before contacting the office for clarification of your results.  We have open lab daily: Monday through Thursday from 1:30-4:30 PM and Friday from 1:30-4:00 PM at the office of Dr. Pollyann SavoyShaili Deveshwar, [facility].   Please be advised, all patients with office appointments requiring lab work will take precedent over walk-in lab work.  If possible, please come for your lab work on Monday and Friday afternoons, as you may experience shorter wait times. The office is located at [address], [address], [place], [place] [address] No appointment is necessary.   Labs are drawn by [facility]. Please bring your co-pay at the time of your lab draw.  You may receive a bill from [facility] for your lab work.  Please note if you are on Hydroxychloroquine and and an order has been placed for a Hydroxychloroquine level, you will need to have it drawn 4 hours or more after your last dose.  If you wish to have your labs drawn at another location, please call the office 24 hours in advance to send orders.  If you have any questions regarding directions or hours of operation,  please call [phone].   As a reminder, please drink plenty of water prior to coming for your lab work. Thanks!  Hand Exercises Hand exercises can be helpful for almost anyone. These exercises can strengthen the hands, improve flexibility and movement, and increase blood flow to the hands. These  results can make work and daily tasks easier. Hand exercises can be especially helpful for people who have joint pain from arthritis or have nerve damage from overuse (carpal tunnel syndrome). These exercises can also help people who have injured a hand. Exercises Most of these hand exercises are gentle stretching and motion exercises. It is usually safe to do them often throughout the day. Warming up your hands before exercise may help to reduce stiffness. You can do this with gentle massage or by placing your hands in warm water for 10-15 minutes. It is normal to feel some stretching, pulling, tightness, or mild discomfort as you begin new exercises. This will gradually improve. Stop an exercise right away if you feel sudden, severe pain or your pain gets worse. Ask your health care provider which exercises are best for you. Knuckle bend or "claw" fist  Stand or sit with your arm, hand, and all five fingers pointed straight up. Make sure to keep your wrist straight during the exercise. Gently bend your fingers down toward your palm until the tips of your fingers are touching the top of your palm. Keep your big knuckle straight and just bend the small knuckles in your fingers. Hold this position for __________ seconds. Straighten (extend) your fingers back to the starting position. Repeat this exercise 5-10 times with each hand. Full finger fist  Stand or sit with your arm, hand, and all five fingers pointed straight up. Make sure to keep your wrist straight during the  exercise. Gently bend your fingers into your palm until the tips of your fingers are touching the middle of your palm. Hold this position for __________ seconds. Extend your fingers back to the starting position, stretching every joint fully. Repeat this exercise 5-10 times with each hand. Straight fist Stand or sit with your arm, hand, and all five fingers pointed straight up. Make sure to keep your wrist straight during the  exercise. Gently bend your fingers at the big knuckle, where your fingers meet your hand, and the middle knuckle. Keep the knuckle at the tips of your fingers straight and try to touch the bottom of your palm. Hold this position for __________ seconds. Extend your fingers back to the starting position, stretching every joint fully. Repeat this exercise 5-10 times with each hand. Tabletop  Stand or sit with your arm, hand, and all five fingers pointed straight up. Make sure to keep your wrist straight during the exercise. Gently bend your fingers at the big knuckle, where your fingers meet your hand, as far down as you can while keeping the small knuckles in your fingers straight. Think of forming a tabletop with your fingers. Hold this position for __________ seconds. Extend your fingers back to the starting position, stretching every joint fully. Repeat this exercise 5-10 times with each hand. Finger spread  Place your hand flat on a table with your palm facing down. Make sure your wrist stays straight as you do this exercise. Spread your fingers and thumb apart from each other as far as you can until you feel a gentle stretch. Hold this position for __________ seconds. Bring your fingers and thumb tight together again. Hold this position for __________ seconds. Repeat this exercise 5-10 times with each hand. Making circles  Stand or sit with your arm, hand, and all five fingers pointed straight up. Make sure to keep your wrist straight during the exercise. Make a circle by touching the tip of your thumb to the tip of your index finger. Hold for __________ seconds. Then open your hand wide. Repeat this motion with your thumb and each finger on your hand. Repeat this exercise 5-10 times with each hand. Thumb motion  Sit with your forearm resting on a table and your wrist straight. Your thumb should be facing up toward the ceiling. Keep your fingers relaxed as you move your thumb. Lift  your thumb up as high as you can toward the ceiling. Hold for __________ seconds. Bend your thumb across your palm as far as you can, reaching the tip of your thumb for the small finger (pinkie) side of your palm. Hold for __________ seconds. Repeat this exercise 5-10 times with each hand. Grip strengthening  Hold a stress ball or other soft ball in the middle of your hand. Slowly increase the pressure, squeezing the ball as much as you can without causing pain. Think of bringing the tips of your fingers into the middle of your palm. All of your finger joints should bend when doing this exercise. Hold your squeeze for __________ seconds, then relax. Repeat this exercise 5-10 times with each hand. Contact a health care provider if: Your hand pain or discomfort gets much worse when you do an exercise. Your hand pain or discomfort does not improve within 2 hours after you exercise. If you have any of these problems, stop doing these exercises right away. Do not do them again unless your health care provider says that you can. Get help right away if: You  develop sudden, severe hand pain or swelling. If this happens, stop doing these exercises right away. Do not do them again unless your health care provider says that you can. This information is not intended to replace advice given to you by your health care provider. Make sure you discuss any questions you have with your health care provider. Document Revised: 08/12/2020 Document Reviewed: 08/12/2020 Elsevier Patient Education  2022 [employer].

## 2021-05-28 LAB — COMPLETE METABOLIC PANEL WITH GFR
AG Ratio: 1.8 (calc) (ref 1.0–2.5)
ALT: 34 U/L (ref 9–46)
AST: 24 U/L (ref 10–35)
Albumin: 4.2 g/dL (ref 3.6–5.1)
Alkaline phosphatase (APISO): 60 U/L (ref 35–144)
BUN: 20 mg/dL (ref 7–25)
CO2: 32 mmol/L (ref 20–32)
Calcium: 9.3 mg/dL (ref 8.6–10.3)
Chloride: 108 mmol/L (ref 98–110)
Creat: 1.03 mg/dL (ref 0.70–1.35)
Globulin: 2.3 g/dL (calc) (ref 1.9–3.7)
Glucose, Bld: 93 mg/dL (ref 65–99)
Potassium: 4.3 mmol/L (ref 3.5–5.3)
Sodium: 145 mmol/L (ref 135–146)
Total Bilirubin: 0.7 mg/dL (ref 0.2–1.2)
Total Protein: 6.5 g/dL (ref 6.1–8.1)
eGFR: 80 mL/min/{1.73_m2} (ref >=60)

## 2021-05-28 LAB — CBC WITH DIFFERENTIAL/PLATELET
Absolute Monocytes: 524 cells/uL (ref 200–950)
Basophils Absolute: 38 cells/uL (ref 0–200)
Basophils Relative: 0.7 %
Eosinophils Absolute: 92 cells/uL (ref 15–500)
Eosinophils Relative: 1.7 %
HCT: 43.3 % (ref 38.5–50.0)
Hemoglobin: 14.6 g/dL (ref 13.2–17.1)
Lymphs Abs: 1420 cells/uL (ref 850–3900)
MCH: 32.1 pg (ref 27.0–33.0)
MCHC: 33.7 g/dL (ref 32.0–36.0)
MCV: 95.2 fL (ref 80.0–100.0)
MPV: 10.2 fL (ref 7.5–12.5)
Monocytes Relative: 9.7 %
Neutro Abs: 3326 cells/uL (ref 1500–7800)
Neutrophils Relative %: 61.6 %
Platelets: 258 10*3/uL (ref 140–400)
RBC: 4.55 10*6/uL (ref 4.20–5.80)
RDW: 12.6 % (ref 11.0–15.0)
Total Lymphocyte: 26.3 %
WBC: 5.4 10*3/uL (ref 3.8–10.8)

## 2021-05-30 NOTE — Progress Notes (Signed)
CBC and CMP WNL

## 2021-06-13 DIAGNOSIS — B351 Tinea unguium: Secondary | ICD-10-CM

## 2021-08-18 ENCOUNTER — Ambulatory Visit: Payer: Medicare Other | Admitting: Sports Medicine

## 2021-08-18 DIAGNOSIS — M79675 Pain in left toe(s): Secondary | ICD-10-CM | POA: Diagnosis not present

## 2021-08-18 DIAGNOSIS — B351 Tinea unguium: Secondary | ICD-10-CM

## 2021-08-18 DIAGNOSIS — M79674 Pain in right toe(s): Secondary | ICD-10-CM | POA: Diagnosis not present

## 2021-08-18 NOTE — Progress Notes (Signed)
Subjective: ?[name] is a 66 y.o. male patient seen today in office today for follow-up evaluation after completing laser.  Patient completed his 6 laser treatment May 13, 2021.  Patient reports that he cannot tell much of a difference and is wondering what else he should do for care.  Patient denies any other pedal complaints at this time. ? ?Patient Active Problem List  ? Diagnosis Date Noted  ? Mixed hyperlipidemia 06/25/2020  ? GERD without esophagitis 07/23/2018  ? Primary osteoarthritis of both hands 02/05/2018  ? History of partial knee replacement 02/05/2018  ? Anxiety and depression 02/05/2018  ? History of kidney stones 02/05/2018  ? High risk medication use 02/05/2018  ? Seronegative Inflammatory arthritis 01/15/2017  ? Primary osteoarthritis involving multiple joints 08/22/2016  ? Carpal tunnel syndrome of left wrist 04/13/2016  ? Chronic recurrent major depressive disorder ([facility]) 11/02/2015  ? Malaise and fatigue 11/02/2015  ? Seronegative rheumatoid arthritis ([facility]) 11/02/2015  ? ? ?Current Outpatient Medications on File Prior to Visit  ?Medication Sig Dispense Refill  ? buPROPion (WELLBUTRIN XL) 150 MG 24 hr tablet Take 450 mg by mouth.    ? buPROPion (WELLBUTRIN XL) 150 MG 24 hr tablet 1 tablet in the morning    ? celecoxib (CELEBREX) 100 MG capsule 1 capsule with food    ? celecoxib (CELEBREX) 200 MG capsule TAKE 1 CAPSULE DAILY    ? cetirizine (ZYRTEC) 10 MG tablet Take 10 mg by mouth daily. (Patient not taking: Reported on 05/27/2021)    ? CIALIS 5 MG tablet Take 2.5 mg by mouth every other day.  5  ? Cinnamon Bark POWD Take by mouth.    ? CINNAMON PO Take by mouth daily.    ? CRANBERRY PO Take by mouth daily.    ? Cranberry POWD Take by mouth.    ? eszopiclone (LUNESTA) 2 MG TABS tablet Take one by mouth at night prn insomnia.  Refills after prescription expiration require follow-up evaluation at the office.    ? folic acid (FOLVITE) 1 MG tablet Take 2 tablets (2 mg total) by mouth daily.  180 tablet 3  ? methotrexate 50 MG/2ML injection INJECT 0.8 MILLILITERS INTO THE SKIN ONCE A WEEK 10 mL 0  ? Omega-3 Fatty Acids (FISH OIL PO) Take 1,250 mg by mouth daily.     ? pantoprazole (PROTONIX) 20 MG tablet 1 tablet    ? pantoprazole (PROTONIX) 40 MG tablet Take 40 mg by mouth daily.     ? sulfaSALAzine (AZULFIDINE) 500 MG EC tablet Take 2 tablets (1,000 mg total) by mouth 2 (two) times daily. 360 tablet 0  ? tadalafil (CIALIS) 20 MG tablet 1 tablet as needed    ? Tuberculin-Allergy Syringes 27G X 1/2" 1 ML MISC Use 1 syringe once weekly to inject methotrexate. 12 each 3  ? TURMERIC PO Take by mouth daily.    ? Turmeric POWD Take by mouth.    ? Vitamin E 500 UNIT/GM POWD Take by mouth.    ? VITAMIN E PO Take 400 mg by mouth daily.     ? ?No current facility-administered medications on file prior to visit.  ? ? ?Allergies  ?Allergen Reactions  ? Nicotine   ?  Other reaction(s): Other (see comments)  ? ? ?Objective: ?Physical Exam ? ?General: Well developed, nourished, no acute distress, awake, alert and oriented x 3 ? ?General:  Alert and oriented x 3, in no acute distress ? ?Dermatology: Skin is warm, dry, and supple  bilateral. Nails are short thickened discolored.  Minimal callus subfirst toes bilateral and dry skin diffuse.  There is no lower extremity erythema, no eccymosis, no open lesions present bilateral.  ? ?Vascular: Dorsalis Pedis and Posterior Tibial pedal pulses are 2/4 bilateral. + hair growth noted bilateral. Capillary Fill Time is 3 seconds in all digits. No varicosities, No edema bilateral lower extremities.  ? ?Neurological: Sensation grossly intact to light touch bilateral. ? ?Musculoskeletal: Minimal pain to affected nails.  Strength 5/5 bilateral.  ? ?Assessment and Plan:  ?Problem List Items Addressed This Visit   ?None ?Visit Diagnoses   ? ? Nail fungus    -  Primary  ? Toe pain, bilateral      ? ?  ? ? ?-Examined patient.  ?-Discussed treatment options for painful mycotic nails status  post laser treatment ?-Advised patient to give time to allow nails to continue to grow out over the next 3 months recommend to add formula 7 ?-Encourage patient to soak with vinegar as directed ?-Mechanically debrided and reduced all painful mycotic nails with sterile nail nipper and dremel nail file without incident.  No other issues noted. ?-Patient to return in 3 months for follow up evaluation/medication check using formula 7 or sooner if symptoms worsen. ? ?Asencion Islam, DPM ? ?

## 2021-10-19 NOTE — Progress Notes (Signed)
Office Visit Note  Patient: [name]             Date of Birth: 06/17/1955           MRN: [id number]             PCP: Gordan Payment., MD Referring: Gordan Payment., MD Visit Date: 10/25/2021 Occupation: @GUAROCC @  Subjective:  Medication management  History of Present Illness: KATAI MARSICO is a 66 y.o. male history of seronegative inflammatory arthritis.  He states he has been out of subcutaneous methotrexate for almost 5 months now.  He is a still on sulfasalazine 500 mg 2 tablets twice daily.  He states he recently developed pain and swelling in his both knees.  He states the swelling eventually resolved.  He was seen by an orthopedic surgeon and had x-rays at [facility].  He was advised total knee replacement in the future.  He also dislocated his left shoulder joint about 2 months ago which was popped back into his shoulder by the orthopedic surgeon.  Currently he does not have any joint pain or swelling.  Activities of Daily Living:  Patient reports morning stiffness for 0  none .   Patient Denies nocturnal pain.  Difficulty dressing/grooming: Reports Difficulty climbing stairs: Denies Difficulty getting out of chair: Denies Difficulty using hands for taps, buttons, cutlery, and/or writing: Denies  Review of Systems  Constitutional:  Positive for fatigue.  HENT:  Negative for mouth dryness.   Eyes:  Positive for dryness.  Respiratory:  Negative for shortness of breath.   Cardiovascular:  Negative for swelling in legs/feet.  Gastrointestinal:  Negative for constipation.  Endocrine: Positive for heat intolerance.  Genitourinary:  Negative for difficulty urinating.  Musculoskeletal:  Positive for gait problem and joint swelling.  Skin:  Negative for rash.  Allergic/Immunologic: Negative for susceptible to infections.  Neurological:  Negative for numbness.  Hematological:  Positive for bruising/bleeding tendency.  Psychiatric/Behavioral:  Positive for sleep  disturbance.     PMFS History:  Patient Active Problem List   Diagnosis Date Noted   Mixed hyperlipidemia 06/25/2020   GERD without esophagitis 07/23/2018   Primary osteoarthritis of both hands 02/05/2018   History of partial knee replacement 02/05/2018   Anxiety and depression 02/05/2018   History of kidney stones 02/05/2018   High risk medication use 02/05/2018   Seronegative Inflammatory arthritis 01/15/2017   Primary osteoarthritis involving multiple joints 08/22/2016   Carpal tunnel syndrome of left wrist 04/13/2016   Chronic recurrent major depressive disorder (HCC) 11/02/2015   Malaise and fatigue 11/02/2015   Seronegative rheumatoid arthritis (HCC) 11/02/2015    Past Medical History:  Diagnosis Date   Arthritis    Hyperthyroidism    Kidney stone     Family History  Problem Relation Age of Onset   Cancer Mother        breast   Past Surgical History:  Procedure Laterality Date   ANKLE ARTHROPLASTY Right    1989 right ankle reconstruction   CARPAL TUNNEL RELEASE Bilateral    JOINT REPLACEMENT     right knee partial    JOINT REPLACEMENT     left knee partial   KNEE ARTHROPLASTY     KNEE ARTHROSCOPY     TOTAL SHOULDER ARTHROPLASTY     Social History   Social History Narrative   Not on file   Immunization History  Administered Date(s) Administered   11/04/2015 (J&J) SARS-COV-2 Vaccination 10/15/2019   Moderna Sars-Covid-2 Vaccination 05/12/2020  Pneumococcal Conjugate-13 08/28/2016   Pneumococcal Polysaccharide-23 02/26/2017   Td 05/08/2005   Zoster Recombinat (Shingrix) 08/02/2017, 11/30/2017     Objective: Vital Signs: BP (!) 155/90 (BP Location: Left Arm, Patient Position: Sitting, Cuff Size: Small)   Pulse 69   Resp 13   Ht 5\' 10"  (1.778 m)   Wt 190 lb 6.4 oz (86.4 kg)   BMI 27.32 kg/m    Physical Exam Vitals and nursing note reviewed.  Constitutional:      Appearance: He is well-developed.  HENT:     Head: Normocephalic and atraumatic.   Eyes:     Conjunctiva/sclera: Conjunctivae normal.     Pupils: Pupils are equal, round, and reactive to light.  Cardiovascular:     Rate and Rhythm: Normal rate and regular rhythm.     Heart sounds: Normal heart sounds.  Pulmonary:     Effort: Pulmonary effort is normal.     Breath sounds: Normal breath sounds.  Abdominal:     General: Bowel sounds are normal.     Palpations: Abdomen is soft.  Musculoskeletal:     Cervical back: Normal range of motion and neck supple.  Skin:    General: Skin is warm and dry.     Capillary Refill: Capillary refill takes less than 2 seconds.  Neurological:     Mental Status: He is alert and oriented to person, place, and time.  Psychiatric:        Behavior: Behavior normal.      Musculoskeletal Exam: C-spine thoracic and lumbar spine were in good range of motion.  Shoulder joints, elbow joints, wrist joints, MCPs PIPs and DIPs with good range of motion with no synovitis.  Bilateral PIP and DIP thickening was noted.  Hip joints in good range of motion.  He had warmth on palpation of his bilateral knee joints.  He had partial left knee joint replacement in the past.  There was no tenderness over ankles or MTPs.  CDAI Exam: CDAI Score: 0.4  Patient Global: 2 mm; Provider Global: 2 mm Swollen: 0 ; Tender: 0  Joint Exam 10/25/2021   No joint exam has been documented for this visit   There is currently no information documented on the homunculus. Go to the Rheumatology activity and complete the homunculus joint exam.  Investigation: No additional findings.  Imaging: No results found.  Recent Labs: Lab Results  Component Value Date   WBC 5.4 05/27/2021   HGB 14.6 05/27/2021   PLT 258 05/27/2021   NA 145 05/27/2021   K 4.3 05/27/2021   CL 108 05/27/2021   CO2 32 05/27/2021   GLUCOSE 93 05/27/2021   BUN 20 05/27/2021   CREATININE 1.03 05/27/2021   BILITOT 0.7 05/27/2021   AST 24 05/27/2021   ALT 34 05/27/2021   PROT 6.5 05/27/2021    CALCIUM 9.3 05/27/2021   GFRAA 95 10/18/2020   QFTBGOLDPLUS NEGATIVE 06/06/2017    Speciality Comments: No specialty comments available.  Procedures:  No procedures performed Allergies: Nicotine   Assessment / Plan:     Visit Diagnoses: Seronegative Inflammatory arthritis-he has been out of methotrexate for 5 months now.  He is still taking sulfasalazine 500 mg, 2 tablets p.o. twice daily.  He developed pain and swelling in his bilateral knee joints.  He states his symptoms resolved by itself.  He has been taking Celebrex 100 mg p.o. daily.  He was evaluated by orthopedic surgeon.  He was advised total knee replacement in the future.  He  denies any discomfort today.  He had warmth on palpation of his bilateral knee joints.  No effusion or swelling was noted.  None of the other joints were swollen.  He could not get methotrexate subcu injections due to shortage.  I discussed the option of switching to methotrexate tablets.  He was in agreement.  We will send the prescription for methotrexate 8 tablets p.o. weekly total 30-day supply to the local pharmacy and then we will send 90-day supply for mail in prescription once able to give us the information about the pharmacy.  He is still taking folic acid 2 mg daily.  He will continue sulfasalazine as prescribed.  High risk medication use -(methotrexate 0.8 mL sq injections once weekly-on hold due to shortage), folic acid 2 mg daily, and sulfasalazine 500 mg 2 tablets by mouth twice daily.  Will obtain lab work today.  He was advised to get labs every 3 months.  Information regarding immunization was placed in the AVS.  He was also advised to hold methotrexate and sulfasalazine if he develops an infection and resume after the infection resolves.  Primary osteoarthritis of both hands-joint protection muscle strengthening was discussed.  Carpal tunnel syndrome of left wrist-currently not symptomatic.  History of partial knee replacement - Left 2015 and  right 2017.  Patient states he developed pain and swelling in his bilateral knee joints recently.  He had warmth on palpation today.  No swelling was noted.  He has been out of methotrexate for 5 months.  Will resume methotrexate.  Anxiety and depression  History of kidney stones  Orders: Orders Placed This Encounter  Procedures   CBC with Differential/Platelet   COMPLETE METABOLIC PANEL WITH GFR   Meds ordered this encounter  Medications   methotrexate (RHEUMATREX) 2.5 MG tablet    Sig: Take 8 tablets (20 mg total) by mouth once a week. Caution:Chemotherapy. Protect from light.    Dispense:  32 tablet    Refill:  0     Follow-Up Instructions: Return in about 5 months (around 03/27/2022) for Inflammatory arthritis.   Pollyann SavoyShaili Sutton Plake, MD  Note - This record has been created using Animal nutritionistDragon software.  Chart creation errors have been sought, but may not always  have been located. Such creation errors do not reflect on  the standard of medical care.

## 2021-10-25 ENCOUNTER — Ambulatory Visit: Payer: Medicare Other | Admitting: Rheumatology

## 2021-10-25 ENCOUNTER — Encounter: Payer: Self-pay | Admitting: Rheumatology

## 2021-10-25 VITALS — BP 155/90 | HR 69 | Resp 13 | Ht 70.0 in | Wt 190.4 lb

## 2021-10-25 DIAGNOSIS — F32A Depression, unspecified: Secondary | ICD-10-CM

## 2021-10-25 DIAGNOSIS — Z96659 Presence of unspecified artificial knee joint: Secondary | ICD-10-CM

## 2021-10-25 DIAGNOSIS — M1990 Unspecified osteoarthritis, unspecified site: Secondary | ICD-10-CM | POA: Diagnosis not present

## 2021-10-25 DIAGNOSIS — G5602 Carpal tunnel syndrome, left upper limb: Secondary | ICD-10-CM | POA: Diagnosis not present

## 2021-10-25 DIAGNOSIS — M19042 Primary osteoarthritis, left hand: Secondary | ICD-10-CM

## 2021-10-25 DIAGNOSIS — Z79899 Other long term (current) drug therapy: Secondary | ICD-10-CM | POA: Diagnosis not present

## 2021-10-25 DIAGNOSIS — M19041 Primary osteoarthritis, right hand: Secondary | ICD-10-CM | POA: Diagnosis not present

## 2021-10-25 DIAGNOSIS — F419 Anxiety disorder, unspecified: Secondary | ICD-10-CM

## 2021-10-25 DIAGNOSIS — Z87442 Personal history of urinary calculi: Secondary | ICD-10-CM

## 2021-10-25 MED ORDER — METHOTREXATE 2.5 MG PO TABS: 20.0000 mg | ORAL_TABLET | ORAL | 0 refills | Status: DC

## 2021-10-25 NOTE — Patient Instructions (Signed)
Standing Labs We placed an order today for your standing lab work.   Please have your standing labs drawn in September and every 3 months  If possible, please have your labs drawn 2 weeks prior to your appointment so that the provider can discuss your results at your appointment.  Please note that you may see your imaging and lab results in MyChart before we have reviewed them. We may be awaiting multiple results to interpret others before contacting you. Please allow our office up to 72 hours to thoroughly review all of the results before contacting the office for clarification of your results.  We have open lab daily: Monday through Thursday from 1:30-4:30 PM and Friday from 1:30-4:00 PM at the office of Dr. [name], [facility].   Please be advised, all patients with office appointments requiring lab work will take precedent over walk-in lab work.  If possible, please come for your lab work on Monday and Friday afternoons, as you may experience shorter wait times. The office is located at [address], [place], [place] [address] No appointment is necessary.   Labs are drawn by [facility]. Please bring your co-pay at the time of your lab draw.  You may receive a bill from [facility] for your lab work.  Please note if you are on Hydroxychloroquine and and an order has been placed for a Hydroxychloroquine level, you will need to have it drawn 4 hours or more after your last dose.  If you wish to have your labs drawn at another location, please call the office 24 hours in advance to send orders.  If you have any questions regarding directions or hours of operation,  please call [phone].   As a reminder, please drink plenty of water prior to coming for your lab work. Thanks!   Vaccines You are taking a medication(s) that can suppress your immune system.  The following immunizations are recommended: Flu annually Covid-19  Td/Tdap (tetanus, diphtheria,  pertussis) every 10 years Pneumonia (Prevnar 15 then Pneumovax 23 at least 1 year apart.  Alternatively, can take Prevnar 20 without needing additional dose) Shingrix: 2 doses from 4 weeks to 6 months apart  If you have signs or symptoms of an infection or start antibiotics: First, call your PCP for workup of your infection. Hold your medication through the infection, until you complete your antibiotics, and until symptoms resolve if you take the following: Injectable medication (Actemra, Benlysta, Cimzia, Cosentyx, Enbrel, Humira, Kevzara, Orencia, Remicade, Simponi, Stelara, Taltz, Tremfya) Methotrexate Leflunomide (Arava) Mycophenolate (Cellcept) Harriette Ohara, Olumiant, or Rinvoq

## 2021-10-26 LAB — CBC WITH DIFFERENTIAL/PLATELET
Absolute Monocytes: 563 cells/uL (ref 200–950)
Basophils Absolute: 58 cells/uL (ref 0–200)
Basophils Relative: 1 %
Eosinophils Absolute: 168 cells/uL (ref 15–500)
Eosinophils Relative: 2.9 %
HCT: 43.7 % (ref 38.5–50.0)
Hemoglobin: 15.3 g/dL (ref 13.2–17.1)
Lymphs Abs: 1491 cells/uL (ref 850–3900)
MCH: 33.8 pg — ABNORMAL HIGH (ref 27.0–33.0)
MCHC: 35 g/dL (ref 32.0–36.0)
MCV: 96.5 fL (ref 80.0–100.0)
MPV: 10.4 fL (ref 7.5–12.5)
Monocytes Relative: 9.7 %
Neutro Abs: 3521 cells/uL (ref 1500–7800)
Neutrophils Relative %: 60.7 %
Platelets: 219 10*3/uL (ref 140–400)
RBC: 4.53 10*6/uL (ref 4.20–5.80)
RDW: 13.1 % (ref 11.0–15.0)
Total Lymphocyte: 25.7 %
WBC: 5.8 10*3/uL (ref 3.8–10.8)

## 2021-10-26 LAB — COMPLETE METABOLIC PANEL WITH GFR
AG Ratio: 2 (calc) (ref 1.0–2.5)
ALT: 26 U/L (ref 9–46)
AST: 23 U/L (ref 10–35)
Albumin: 4.5 g/dL (ref 3.6–5.1)
Alkaline phosphatase (APISO): 72 U/L (ref 35–144)
BUN: 19 mg/dL (ref 7–25)
CO2: 28 mmol/L (ref 20–32)
Calcium: 9.6 mg/dL (ref 8.6–10.3)
Chloride: 105 mmol/L (ref 98–110)
Creat: 1.2 mg/dL (ref 0.70–1.35)
Globulin: 2.3 g/dL (calc) (ref 1.9–3.7)
Glucose, Bld: 76 mg/dL (ref 65–99)
Potassium: 4.1 mmol/L (ref 3.5–5.3)
Sodium: 143 mmol/L (ref 135–146)
Total Bilirubin: 1 mg/dL (ref 0.2–1.2)
Total Protein: 6.8 g/dL (ref 6.1–8.1)
eGFR: 66 mL/min/{1.73_m2} (ref >=60)

## 2021-11-23 ENCOUNTER — Ambulatory Visit: Payer: Medicare Other | Admitting: Podiatry

## 2021-11-23 ENCOUNTER — Encounter: Payer: Self-pay | Admitting: Podiatry

## 2021-11-23 DIAGNOSIS — B351 Tinea unguium: Secondary | ICD-10-CM | POA: Diagnosis not present

## 2021-11-23 MED ORDER — EFINACONAZOLE 10 % EX SOLN: 1.0000 [drp] | Freq: Every day | CUTANEOUS | 2 refills | Status: DC

## 2021-11-23 NOTE — Progress Notes (Signed)
Subjective: [name] is a 66 y.o. male patient seen today in office today for follow-up evaluation after completing laser and formulat 7 for 3 months. .Relates they are trying to get better but still looking thickened and discolored.   Patient completed his 6 laser treatment May 13, 2021.    Patient denies any other pedal complaints at this time.  Patient Active Problem List   Diagnosis Date Noted   Mixed hyperlipidemia 06/25/2020   GERD without esophagitis 07/23/2018   Primary osteoarthritis of both hands 02/05/2018   History of partial knee replacement 02/05/2018   Anxiety and depression 02/05/2018   History of kidney stones 02/05/2018   High risk medication use 02/05/2018   Seronegative Inflammatory arthritis 01/15/2017   Primary osteoarthritis involving multiple joints 08/22/2016   Carpal tunnel syndrome of left wrist 04/13/2016   Chronic recurrent major depressive disorder (HCC) 11/02/2015   Malaise and fatigue 11/02/2015   Seronegative rheumatoid arthritis (HCC) 11/02/2015    Current Outpatient Medications on File Prior to Visit  Medication Sig Dispense Refill   buPROPion (WELLBUTRIN XL) 150 MG 24 hr tablet Take 450 mg by mouth.     buPROPion (WELLBUTRIN XL) 150 MG 24 hr tablet 1 tablet in the morning     celecoxib (CELEBREX) 100 MG capsule 1 capsule with food     cetirizine (ZYRTEC) 10 MG tablet Take 10 mg by mouth daily.     CIALIS 5 MG tablet Take 2.5 mg by mouth every other day. (Patient not taking: Reported on 10/25/2021)  5   Cinnamon Bark POWD Take by mouth.     CINNAMON PO Take by mouth daily.     CRANBERRY PO Take by mouth daily.     Cranberry POWD Take by mouth.     eszopiclone (LUNESTA) 2 MG TABS tablet Take one by mouth at night prn insomnia.  Refills after prescription expiration require follow-up evaluation at the office. (Patient not taking: Reported on 10/25/2021)     folic acid (FOLVITE) 1 MG tablet Take 2 tablets (2 mg total) by mouth daily. (Patient not  taking: Reported on 10/25/2021) 180 tablet 3   levothyroxine (SYNTHROID) 25 MCG tablet levothyroxine 25 mcg tablet  Take 1 tablet (25 mcg total) by mouth Daily at 0600.     methotrexate (RHEUMATREX) 2.5 MG tablet Take 8 tablets (20 mg total) by mouth once a week. Caution:Chemotherapy. Protect from light. 32 tablet 0   Omega-3 Fatty Acids (FISH OIL PO) Take 1,250 mg by mouth daily.      pantoprazole (PROTONIX) 40 MG tablet Take 40 mg by mouth daily.      sulfaSALAzine (AZULFIDINE) 500 MG EC tablet Take 2 tablets (1,000 mg total) by mouth 2 (two) times daily. 360 tablet 0   TURMERIC PO Take by mouth daily.     Turmeric POWD Take by mouth.     Vitamin E 500 UNIT/GM POWD Take by mouth.     VITAMIN E PO Take 400 mg by mouth daily.      No current facility-administered medications on file prior to visit.    Allergies  Allergen Reactions   Nicotine     Other reaction(s): Other (see comments)    Objective: Physical Exam  General: Well developed, nourished, no acute distress, awake, alert and oriented x 3  General:  Alert and oriented x 3, in no acute distress  Dermatology: Skin is warm, dry, and supple bilateral. Nails are short thickened discolored.  Minimal callus subfirst toes bilateral and  dry skin diffuse.  There is no lower extremity erythema, no eccymosis, no open lesions present bilateral.   Vascular: Dorsalis Pedis and Posterior Tibial pedal pulses are 2/4 bilateral. + hair growth noted bilateral. Capillary Fill Time is 3 seconds in all digits. No varicosities, No edema bilateral lower extremities.   Neurological: Sensation grossly intact to light touch bilateral.  Musculoskeletal: Minimal pain to affected nails.  Strength 5/5 bilateral.   Assessment and Plan:  Problem List Items Addressed This Visit   None Visit Diagnoses     Nail fungus    -  Primary       -Examined patient.  -Discussed treatment options for painful mycotic nails status post laser  treatment -Encourage patient to soak with vinegar as directed Will try another round of laser treatments will get scheduled.  -Will also send in prescription for jublia to try.  -Patient to return in 6 months for recheck of nails.   Louann Sjogren, DPM

## 2021-12-08 ENCOUNTER — Ambulatory Visit (INDEPENDENT_AMBULATORY_CARE_PROVIDER_SITE_OTHER): Payer: Medicare Other | Admitting: Podiatry

## 2021-12-08 DIAGNOSIS — B351 Tinea unguium: Secondary | ICD-10-CM

## 2021-12-08 NOTE — Progress Notes (Signed)
Patient presents today for the 1st (2nd round) laser treatment. Diagnosed with nail fungus by Dr. Ralene Cork.   Toenail most affected all ten nails.   All other systems are negative.   Nails were filed thin. Laser therapy was administered to 1-5 toenails bilateral and patient tolerated the treatment well. All safety precautions were in place.    Follow up in 4 weeks for laser #2.

## 2021-12-29 ENCOUNTER — Ambulatory Visit (INDEPENDENT_AMBULATORY_CARE_PROVIDER_SITE_OTHER): Payer: Medicare Other | Admitting: Podiatry

## 2021-12-29 DIAGNOSIS — B351 Tinea unguium: Secondary | ICD-10-CM

## 2021-12-29 NOTE — Progress Notes (Signed)
Patient presents today for the 2nd (2nd round) laser treatment. Diagnosed with nail fungus by Dr. Ralene Cork.    Toenail most affected all ten nails.   All other systems are negative.   Nails were filed thin. Laser therapy was administered to 1-5 toenails bilateral and patient tolerated the treatment well. All safety precautions were in place.    Follow up in 4 weeks for laser # 3.

## 2022-02-10 ENCOUNTER — Ambulatory Visit (INDEPENDENT_AMBULATORY_CARE_PROVIDER_SITE_OTHER): Payer: Medicare Other | Admitting: Podiatry

## 2022-02-10 DIAGNOSIS — B351 Tinea unguium: Secondary | ICD-10-CM

## 2022-02-10 NOTE — Progress Notes (Signed)
Patient presents today for the 3rd (2nd round) laser treatment. Diagnosed with nail fungus by Dr. Blenda Mounts.    Toenail most affected all ten nails.   All other systems are negative.   Nails were filed thin. Laser therapy was administered to 1-5 toenails bilateral and patient tolerated the treatment well. All safety precautions were in place.    Follow up in 4 weeks for laser # 4.

## 2022-03-20 NOTE — Progress Notes (Signed)
Office Visit Note  Patient: [name]             Date of Birth: [birth year]           MRN: [id number]             PCP: Gordan Payment., MD Referring: Gordan Payment., MD Visit Date: 04/03/2022 Occupation: @GUAROCC @  Subjective:  Medication monitoring  History of Present Illness: [name] is a 66 y.o. male with history of seronegative inflammatory arthritis and osteoarthritis.  Patient reports that he has been having issues getting his prescriptions for injectable methotrexate as well as sulfasalazine EC due to shortages at the pharmacy.  He states he has been out of injectable methotrexate for the past 2 months.  He states that he has been taking sulfasalazine 500 mg 1 tablet twice daily instead of 2 tablets twice daily to extend his prescription.  He remains on Celebrex 200 mg every other day for pain relief.  He has intermittent pain and stiffness in both hands and both knees. He denies any joint swelling.  He denies any joint swelling at this time.  He states that he has not been performing any strenuous activities and has been less active than usual.  He states overall he feels much better when he is able to take methotrexate and sulfasalazine consistently. He denies any recent or recurrent infections.      Activities of Daily Living:  Patient reports morning stiffness for 0 minutes.   Patient Denies nocturnal pain.  Difficulty dressing/grooming: Reports Difficulty climbing stairs: Denies Difficulty getting out of chair: Denies Difficulty using hands for taps, buttons, cutlery, and/or writing: Denies  Review of Systems  Constitutional:  Positive for fatigue.  HENT:  Negative for mouth sores and mouth dryness.   Eyes:  Positive for photophobia. Negative for dryness.  Respiratory:  Negative for shortness of breath.   Cardiovascular:  Negative for chest pain and palpitations.  Gastrointestinal:  Negative for blood in stool, constipation and diarrhea.  Endocrine:  Negative for increased urination.  Genitourinary:  Positive for urgency. Negative for involuntary urination.  Musculoskeletal:  Positive for joint pain and joint pain. Negative for gait problem, joint swelling, myalgias, muscle weakness, morning stiffness, muscle tenderness and myalgias.  Skin:  Negative for color change, rash, hair loss and sensitivity to sunlight.  Allergic/Immunologic: Negative for susceptible to infections.  Neurological:  Negative for dizziness and headaches.  Hematological:  Negative for swollen glands.  Psychiatric/Behavioral:  Positive for sleep disturbance. Negative for depressed mood. The patient is not nervous/anxious.     PMFS History:  Patient Active Problem List   Diagnosis Date Noted   Mixed hyperlipidemia 06/25/2020   GERD without esophagitis 07/23/2018   Primary osteoarthritis of both hands 02/05/2018   History of partial knee replacement 02/05/2018   Anxiety and depression 02/05/2018   History of kidney stones 02/05/2018   High risk medication use 02/05/2018   Seronegative Inflammatory arthritis 01/15/2017   Primary osteoarthritis involving multiple joints 08/22/2016   Carpal tunnel syndrome of left wrist 04/13/2016   Chronic recurrent major depressive disorder (HCC) 11/02/2015   Malaise and fatigue 11/02/2015   Seronegative rheumatoid arthritis (HCC) 11/02/2015    Past Medical History:  Diagnosis Date   Arthritis    Hyperthyroidism    Kidney stone     Family History  Problem Relation Age of Onset   Cancer Mother        breast   Past Surgical History:  Procedure  Laterality Date   ANKLE ARTHROPLASTY Right    1989 right ankle reconstruction   CARPAL TUNNEL RELEASE Bilateral    JOINT REPLACEMENT     right knee partial    JOINT REPLACEMENT     left knee partial   KNEE ARTHROPLASTY     KNEE ARTHROSCOPY     TOTAL SHOULDER ARTHROPLASTY     Social History   Social History Narrative   Not on file   Immunization History  Administered  Date(s) Administered   Comptroller (J&J) SARS-COV-2 Vaccination 10/15/2019   Moderna Sars-Covid-2 Vaccination 05/12/2020   Pneumococcal Conjugate-13 08/28/2016   Pneumococcal Polysaccharide-23 02/26/2017   Td 05/08/2005   Zoster Recombinat (Shingrix) 08/02/2017, 11/30/2017     Objective: Vital Signs: BP (!) 172/102 (BP Location: Left Arm, Patient Position: Sitting, Cuff Size: Normal)   Pulse 69   Resp 17   Ht 5\' 10"  (1.778 m)   Wt 199 lb 6.4 oz (90.4 kg)   BMI 28.61 kg/m    Physical Exam Vitals and nursing note reviewed.  Constitutional:      Appearance: He is well-developed.  HENT:     Head: Normocephalic and atraumatic.  Eyes:     Conjunctiva/sclera: Conjunctivae normal.     Pupils: Pupils are equal, round, and reactive to light.  Cardiovascular:     Rate and Rhythm: Normal rate and regular rhythm.     Heart sounds: Normal heart sounds.  Pulmonary:     Effort: Pulmonary effort is normal.     Breath sounds: Normal breath sounds.  Abdominal:     General: Bowel sounds are normal.     Palpations: Abdomen is soft.  Musculoskeletal:     Cervical back: Normal range of motion and neck supple.  Skin:    General: Skin is warm and dry.     Capillary Refill: Capillary refill takes less than 2 seconds.  Neurological:     Mental Status: He is alert and oriented to person, place, and time.  Psychiatric:        Behavior: Behavior normal.      Musculoskeletal Exam: C-spine, thoracic spine, lumbar spine have good range of motion.  Shoulder joints, elbow joints, wrist joints, MCPs, PIPs, DIPs have good range of motion with no synovitis.  Complete fist formation bilaterally.  PIP and DIP thickening consistent with osteoarthritis of both hands.  Hip joints have good range of motion with no groin pain.  Bilateral partial knee replacements have warmth but no effusion.  Ankle joints have good range of motion with no tenderness or inflammation.  CDAI Exam: CDAI Score: 2.4  Patient Global: 2  mm; Provider Global: 2 mm Swollen: 0 ; Tender: 2  Joint Exam 04/03/2022      Right  Left  MCP 3   Tender     MCP 4   Tender        Investigation: No additional findings.  Imaging: DG Foot 2 Views Right  Result Date: 03/28/2022 Please see detailed radiograph report in office note.  DG Foot 2 Views Left  Result Date: 03/28/2022 Please see detailed radiograph report in office note.   Recent Labs: Lab Results  Component Value Date   WBC 5.8 10/25/2021   HGB 15.3 10/25/2021   PLT 219 10/25/2021   NA 143 10/25/2021   K 4.1 10/25/2021   CL 105 10/25/2021   CO2 28 10/25/2021   GLUCOSE 76 10/25/2021   BUN 19 10/25/2021   CREATININE 1.20 10/25/2021   BILITOT 1.0 10/25/2021  AST 23 10/25/2021   ALT 26 10/25/2021   PROT 6.8 10/25/2021   CALCIUM 9.6 10/25/2021   GFRAA 95 10/18/2020   QFTBGOLDPLUS NEGATIVE 06/06/2017    Speciality Comments: No specialty comments available.  Procedures:  No procedures performed Allergies: Nicotine   Assessment / Plan:     Visit Diagnoses: Seronegative Inflammatory arthritis: He has no synovitis on examination today.  He has been taking sulfasalazine 500 mg 1 tablet by mouth twice daily.  He has been out of his prescription for methotrexate for the past 2 months and has been trying to extend his prescription for sulfasalazine due to a shortage at the pharmacy for the enteric-coated tablets.  He remains on Celebrex 200 mg every other day for pain relief.  He has intermittent pain and stiffness in both hands and bilateral partial knee replacements.  No inflammation was noted on examination today. Discussed increasing the dose of sulfasalazine to 500 mg 2 tablets twice daily as monotherapy with his next refill.  He would like to resume injectable methotrexate since he feels that his symptoms are better controlled on combination therapy.  He will call us back with the mail order pharmacy he would like his prescription sent to. He will follow up in  the office in 5 months.    High risk medication use -Sulfasalazine 500 mg 2 tablets by mouth twice daily, methotrexate 0.8 mL subcu as injections once weekly, and folic acid 2 mg daily.  Patient plans to call us back with the mail order pharmacy he would like his prescription sent to for 90-day supply refills.  CBC and CMP updated on 02/02/22.  He will be due to update lab work in December and every 3 months to monitor for drug toxicity.  Patient plans on returning for updated lab work next month.  Future orders for CBC and CMP were placed today. He has not had any recent or recurrent infections.  Advised the patient to hold methotrexate and sulfasalazine if he develops signs or symptoms of an infection and to resume once the infection has completely cleared.  - Plan: COMPLETE METABOLIC PANEL WITH GFR, CBC with Differential/Platelet  Primary osteoarthritis of both hands: He has PIP and DIP thickening consistent with osteoarthritis of both hands.  He has some tenderness over the right third and fourth MCP joints but no active synovitis noted on exam.  He was able to make a complete fist bilaterally.  Discussed the importance of joint protection and muscle strengthening.  Carpal tunnel syndrome of left wrist: Asymptomatic at this time.  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  Warmth but no effusion noted.  Other medical conditions are listed as follows:  Anxiety and depression  History of kidney stones  Orders: Orders Placed This Encounter  Procedures   COMPLETE METABOLIC PANEL WITH GFR   CBC with Differential/Platelet   No orders of the defined types were placed in this encounter.   Follow-Up Instructions: Return in about 5 months (around 09/02/2022) for Inflammatory arthritis .   Gearldine Bienenstock, PA-C  Note - This record has been created using Dragon software.  Chart creation  errors have been sought, but may not always  have been located. Such creation errors do not reflect on  the standard of medical care.

## 2022-03-24 ENCOUNTER — Ambulatory Visit (INDEPENDENT_AMBULATORY_CARE_PROVIDER_SITE_OTHER): Payer: Self-pay | Admitting: *Deleted

## 2022-03-24 ENCOUNTER — Ambulatory Visit (INDEPENDENT_AMBULATORY_CARE_PROVIDER_SITE_OTHER): Payer: Medicare Other

## 2022-03-24 ENCOUNTER — Encounter: Payer: Self-pay | Admitting: Podiatry

## 2022-03-24 ENCOUNTER — Ambulatory Visit: Payer: Medicare Other | Admitting: Podiatry

## 2022-03-24 DIAGNOSIS — M779 Enthesopathy, unspecified: Secondary | ICD-10-CM

## 2022-03-24 DIAGNOSIS — B351 Tinea unguium: Secondary | ICD-10-CM

## 2022-03-24 DIAGNOSIS — M21622 Bunionette of left foot: Secondary | ICD-10-CM

## 2022-03-24 MED ORDER — TRIAMCINOLONE ACETONIDE 10 MG/ML IJ SUSP
10.0000 mg | Freq: Once | INTRAMUSCULAR | Status: AC
  Administered 2022-03-24: 10 mg

## 2022-03-24 NOTE — Progress Notes (Signed)
Patient presents today for the 5th (2nd round) laser treatment. Diagnosed with mycotic nail infection by Dr. Marylene Land.   Toenail most affected all ten nails. Some of the nails are showing new nail growth at the base.  All other systems are negative.  Nails were filed thin. Laser therapy was administered to 1-5 toenails bilateral and patient tolerated the treatment well. All safety precautions were in place.  Follow up in 6 weeks for laser #5

## 2022-03-27 ENCOUNTER — Ambulatory Visit: Payer: Medicare Other | Admitting: Physician Assistant

## 2022-03-27 NOTE — Progress Notes (Signed)
Patient presents with a low subjective:   Patient ID: [name], male   DOB: 66 y.o.   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  Reappoint to recheck as needed  X-rays indicated moderate prominence around the fifth metatarsal head with mild soft tissue swelling no other pathology

## 2022-04-03 ENCOUNTER — Telehealth: Payer: Self-pay

## 2022-04-03 ENCOUNTER — Ambulatory Visit: Payer: Medicare Other | Attending: Physician Assistant | Admitting: Physician Assistant

## 2022-04-03 ENCOUNTER — Encounter: Payer: Self-pay | Admitting: Physician Assistant

## 2022-04-03 VITALS — BP 172/102 | HR 69 | Resp 17 | Ht 70.0 in | Wt 199.4 lb

## 2022-04-03 DIAGNOSIS — M19041 Primary osteoarthritis, right hand: Secondary | ICD-10-CM | POA: Diagnosis not present

## 2022-04-03 DIAGNOSIS — Z96659 Presence of unspecified artificial knee joint: Secondary | ICD-10-CM

## 2022-04-03 DIAGNOSIS — M1990 Unspecified osteoarthritis, unspecified site: Secondary | ICD-10-CM | POA: Diagnosis not present

## 2022-04-03 DIAGNOSIS — Z79899 Other long term (current) drug therapy: Secondary | ICD-10-CM

## 2022-04-03 DIAGNOSIS — G5602 Carpal tunnel syndrome, left upper limb: Secondary | ICD-10-CM

## 2022-04-03 DIAGNOSIS — F32A Depression, unspecified: Secondary | ICD-10-CM

## 2022-04-03 DIAGNOSIS — M19042 Primary osteoarthritis, left hand: Secondary | ICD-10-CM

## 2022-04-03 DIAGNOSIS — Z87442 Personal history of urinary calculi: Secondary | ICD-10-CM

## 2022-04-03 DIAGNOSIS — F419 Anxiety disorder, unspecified: Secondary | ICD-10-CM

## 2022-04-03 NOTE — Telephone Encounter (Signed)
Patient plans to call with mail order pharmacy. At that time, please refill injectable MTX and SSZ per Sherron Ales, PA-C. Thanks!

## 2022-04-03 NOTE — Patient Instructions (Signed)
Standing Labs We placed an order today for your standing lab work.   Please have your standing labs drawn in December and every 3 months    Please have your labs drawn 2 weeks prior to your appointment so that the provider can discuss your lab results at your appointment.  Please note that you may see your imaging and lab results in MyChart before we have reviewed them. We will contact you once all results are reviewed. Please allow our office up to 72 hours to thoroughly review all of the results before contacting the office for clarification of your results.  Lab hours are:   Monday through Thursday from 8:00 am -12:30 pm and 1:00 pm-5:00 pm and Friday from 8:00 am-12:00 pm.  Please be advised, all patients with office appointments requiring lab work will take precedent over walk-in lab work.   Labs are drawn by [facility]. Please bring your co-pay at the time of your lab draw.  You may receive a bill from [facility] for your lab work.  Please note if you are on Hydroxychloroquine and and an order has been placed for a Hydroxychloroquine level, you will need to have it drawn 4 hours or more after your last dose.  If you wish to have your labs drawn at another location, please call the office 24 hours in advance so we can fax the orders.  The office is located at [address], [address], [place], [place] [address] No appointment is necessary.    If you have any questions regarding directions or hours of operation,  please call [phone].   As a reminder, please drink plenty of water prior to coming for your lab work. Thanks!  

## 2022-04-13 DIAGNOSIS — J0100 Acute maxillary sinusitis, unspecified: Secondary | ICD-10-CM | POA: Diagnosis not present

## 2022-04-13 DIAGNOSIS — J209 Acute bronchitis, unspecified: Secondary | ICD-10-CM | POA: Diagnosis not present

## 2022-04-18 NOTE — Telephone Encounter (Signed)
Attempted to contact the patient and left message for patient to call the office to advise of mail order pharmacy.

## 2022-04-19 ENCOUNTER — Telehealth: Payer: Self-pay | Admitting: Genetic Counselor

## 2022-04-19 NOTE — Telephone Encounter (Signed)
Spoke to wife Drinda Butts to set up genetics appt for [name] given his family history of breast cancer in his mother.  Appt scheduled when Drinda Butts is here for radiation-- 1pm on 12/14

## 2022-04-20 ENCOUNTER — Other Ambulatory Visit: Payer: Self-pay

## 2022-04-20 ENCOUNTER — Inpatient Hospital Stay: Payer: Medicare Other

## 2022-04-20 ENCOUNTER — Inpatient Hospital Stay: Payer: Medicare Other | Admitting: Genetic Counselor

## 2022-04-20 DIAGNOSIS — Z7183 Encounter for nonprocreative genetic counseling: Secondary | ICD-10-CM | POA: Diagnosis not present

## 2022-04-20 DIAGNOSIS — Z803 Family history of malignant neoplasm of breast: Secondary | ICD-10-CM | POA: Diagnosis not present

## 2022-04-20 DIAGNOSIS — Z801 Family history of malignant neoplasm of trachea, bronchus and lung: Secondary | ICD-10-CM | POA: Diagnosis not present

## 2022-04-20 LAB — GENETIC SCREENING ORDER

## 2022-04-21 ENCOUNTER — Encounter: Payer: Self-pay | Admitting: Genetic Counselor

## 2022-04-21 NOTE — Progress Notes (Signed)
REFERRING PROVIDER: Self-referred  PRIMARY PROVIDER:  Raina Mina., MD  PRIMARY REASON FOR VISIT:  1. Family history of breast cancer     HISTORY OF PRESENT ILLNESS:   [name], a 66 y.o. male, was seen for a [facility] cancer genetics consultation due to a family history of breast cancer in his mother.  Mr. Taul wife recently had negative genetic testing after her breast cancer diagnosis, and the they are interested in providing more information to their children about cancer risk through genetic testing.  Mr. Friday presents to clinic today to discuss the possibility of a hereditary predisposition to cancer, to discuss genetic testing, and to further clarify his future cancer risks, as well as potential cancer risks for family members.   Mr. Schuh is a 66 y.o. male with no personal history of cancer.    CANCER HISTORY:  Oncology History   No history exists.     RISK FACTORS:  Colonoscopy: yes; no polyps per patient Dermatology screening: yes; as needed  Past Medical History:  Diagnosis Date   Arthritis    Hyperthyroidism    Kidney stone     Past Surgical History:  Procedure Laterality Date   ANKLE ARTHROPLASTY Right    1989 right ankle reconstruction   CARPAL TUNNEL RELEASE Bilateral    JOINT REPLACEMENT     right knee partial    JOINT REPLACEMENT     left knee partial   KNEE ARTHROPLASTY     KNEE ARTHROSCOPY     TOTAL SHOULDER ARTHROPLASTY         FAMILY HISTORY:  We obtained a detailed, 4-generation family history.  Significant diagnoses are listed below: Family History  Problem Relation Age of Onset   Breast cancer Mother 39   Lung cancer Maternal Uncle        mother's paternal half brother    Mr. Thwaites is unaware of previous family history of genetic testing for hereditary cancer risks. There is no reported Ashkenazi Jewish ancestry. There is no known consanguinity.  GENETIC COUNSELING ASSESSMENT: Mr. Enrico is a 66 y.o. male with a family  history which is somewhat suggestive of a hereditary cancer syndrome and predisposition to cancer given his mother's age when she was diagnosed with breast cancer. We, therefore, discussed and recommended the following at today's visit.   DISCUSSION: We discussed that 5 - 10% of cancer is hereditary, with most cases of hereditary breast cancer associated with mutations in BRCA1/2.  There are other genes that can be associated with hereditary breast cancer syndromes.  We discussed that testing is beneficial for several reasons, including knowing about other cancer risks, identifying potential screening and risk-reduction options that may be appropriate, and to understanding if other family members could be at risk for cancer and allowing them to undergo genetic testing.  We reviewed the characteristics, features and inheritance patterns of hereditary cancer syndromes. We also discussed genetic testing, including the appropriate family members to test, the process of testing, insurance coverage and turn-around-time for results. We discussed the implications of a negative, positive, and variant of uncertain significant result. We discussed that negative results would be uninformative given that Mr. Degregory does not have a personal history of cancer. We recommended Mr. Desmarais pursue genetic testing for a panel that contains genes associated with breast, skin, and other cancers.  The CancerNext-Expanded gene panel offered by [facility] and includes sequencing, rearrangement, and RNA analysis for the following 77 genes: AIP, ALK, APC, ATM, AXIN2, BAP1,  BARD1, BLM, BMPR1A, BRCA1, BRCA2, BRIP1, CDC73, CDH1, CDK4, CDKN1B, CDKN2A, CHEK2, CTNNA1, DICER1, FANCC, FH, FLCN, GALNT12, KIF1B, LZTR1, MAX, MEN1, MET, MLH1, MSH2, MSH3, MSH6, MUTYH, NBN, NF1, NF2, NTHL1, PALB2, PHOX2B, PMS2, POT1, PRKAR1A, PTCH1, PTEN, RAD51C, RAD51D, RB1, RECQL, RET, SDHA, SDHAF2, SDHB, SDHC, SDHD, SMAD4, SMARCA4, SMARCB1, SMARCE1, STK11,  SUFU, TMEM127, TP53, TSC1, TSC2, VHL and XRCC2 (sequencing and deletion/duplication); EGFR, EGLN1, HOXB13, KIT, MITF, PDGFRA, POLD1, and POLE (sequencing only); EPCAM and GREM1 (deletion/duplication only).   Based on Mr. Nealy's maternal family history of breast cancer, he meets medical criteria for genetic testing. Despite that he meets criteria, he may still have an out of pocket cost. We discussed that if his out of pocket cost for testing is over $100, the laboratory should contact them to discuss self-pay options and/or patient pay assistance programs.   We discussed the Genetic Information Non-Discrimination Act (GINA) of 2008, which helps protect individuals against genetic discrimination based on their genetic test results.  It impacts both health insurance and employment.  With health insurance, it protects against genetic test results being used for increased premiums or policy termination. For employment, it protects against hiring, firing and promoting decisions based on genetic test results.  GINA does not apply to those in the [employer], those who work for companies with less than 15 employees, and new life insurance or long-term disability insurance policies.  Health status due to a cancer diagnosis is not protected under GINA.  PLAN: After considering the risks, benefits, and limitations, Mr. Leavitt provided informed consent to pursue genetic testing and the blood sample was sent to [facility] for analysis of the CancerNext-Expanded+RNA Panel. Results should be available within approximately 3 weeks' time, at which point they will be disclosed by telephone to Mr. Mattson, as will any additional recommendations warranted by these results. Mr. Hubert will receive a summary of his genetic counseling visit and a copy of his results once available. This information will also be available in Epic.   Mr. Seoane questions were answered to his satisfaction today. Our contact information was  provided should additional questions or concerns arise.   Haruki Arnold M. Joette Catching, [place], [facility] Genetic Counselor [email or web address] (P) [phone]   The patient was seen for less than 15 minutes in face-to-face genetic counseling.  The patient was seen alone.  Drs. Lindi Adie and/or Burr Medico were available to discuss this case as needed.  _______________________________________________________________________ For Office Staff:  Number of people involved in session: 1 Was an Intern/ student involved with case: no

## 2022-05-05 ENCOUNTER — Other Ambulatory Visit: Payer: Self-pay | Admitting: *Deleted

## 2022-05-05 ENCOUNTER — Other Ambulatory Visit: Payer: Self-pay

## 2022-05-05 ENCOUNTER — Other Ambulatory Visit: Payer: Self-pay | Admitting: Rheumatology

## 2022-05-05 ENCOUNTER — Other Ambulatory Visit (HOSPITAL_BASED_OUTPATIENT_CLINIC_OR_DEPARTMENT_OTHER): Payer: Self-pay

## 2022-05-05 DIAGNOSIS — Z79899 Other long term (current) drug therapy: Secondary | ICD-10-CM | POA: Diagnosis not present

## 2022-05-05 MED ORDER — SULFASALAZINE 500 MG PO TBEC: 1000.0000 mg | DELAYED_RELEASE_TABLET | Freq: Two times a day (BID) | ORAL | 0 refills | Status: DC

## 2022-05-05 MED ORDER — METHOTREXATE SODIUM CHEMO INJECTION 50 MG/2ML
20.0000 mg | INTRAMUSCULAR | 0 refills | Status: DC
  Filled 2022-05-05 (×2): qty 10, 84d supply, fill #0

## 2022-05-05 NOTE — Telephone Encounter (Signed)
Next Visit: 09/05/2022  Last Visit: 04/03/2022  Last Fill: 10/25/2021  DX: Seronegative Inflammatory arthritis   Current Dose per office note 04/03/2022: methotrexate 0.8 mL subcu as injections once weekly   Labs: 02/02/2022 Total Bilirubin 1.1, CO2 32, Glucose 100 (Updated labs today)  Okay to refill MTX?

## 2022-05-05 NOTE — Telephone Encounter (Signed)
Patient came into the office to request a refill of Sulfasalazine 500mg  and Methotrexate 2.5mg  be sent to [facility].   Patient would like to know if there is anywhere he can get the injectable MTX because oral isn't as effective for him but if not he is fine with Oral.   Patient states he would like Dr. to take over the Celebrex prescription that his PCP is prescribing now. Patient states Dr. Corliss Skains has prescribed it in the past.

## 2022-05-05 NOTE — Telephone Encounter (Signed)
Next Visit: 09/05/2022   Last Visit: 04/03/2022   Last Fill: 05/27/2021   DX: Seronegative Inflammatory arthritis    Current Dose per office note 04/03/2022: Sulfasalazine 500 mg 2 tablets by mouth twice daily    Labs: 02/02/2022 Total Bilirubin 1.1, CO2 32, Glucose 100 (Updated labs today)  Okay to refill SSZ?

## 2022-05-05 NOTE — Telephone Encounter (Signed)
I called the [facility] at [facility]) and they do have the injectable MTX in stock. Patient is ok with the prescription being sent there.

## 2022-05-05 NOTE — Addendum Note (Signed)
Addended by: Henriette Combs on: 05/05/2022 12:03 PM   Modules accepted: Orders

## 2022-05-05 NOTE — Addendum Note (Signed)
Addended by: Henriette Combs on: 05/05/2022 12:15 PM   Modules accepted: Orders

## 2022-05-06 LAB — CBC WITH DIFFERENTIAL/PLATELET
Absolute Monocytes: 571 cells/uL (ref 200–950)
Basophils Absolute: 41 cells/uL (ref 0–200)
Basophils Relative: 0.8 %
Eosinophils Absolute: 122 cells/uL (ref 15–500)
Eosinophils Relative: 2.4 %
HCT: 43.8 % (ref 38.5–50.0)
Hemoglobin: 14.9 g/dL (ref 13.2–17.1)
Lymphs Abs: 1474 cells/uL (ref 850–3900)
MCH: 32.3 pg (ref 27.0–33.0)
MCHC: 34 g/dL (ref 32.0–36.0)
MCV: 94.8 fL (ref 80.0–100.0)
MPV: 10.4 fL (ref 7.5–12.5)
Monocytes Relative: 11.2 %
Neutro Abs: 2892 cells/uL (ref 1500–7800)
Neutrophils Relative %: 56.7 %
Platelets: 221 10*3/uL (ref 140–400)
RBC: 4.62 10*6/uL (ref 4.20–5.80)
RDW: 12.8 % (ref 11.0–15.0)
Total Lymphocyte: 28.9 %
WBC: 5.1 10*3/uL (ref 3.8–10.8)

## 2022-05-06 LAB — COMPLETE METABOLIC PANEL WITH GFR
AG Ratio: 1.7 (calc) (ref 1.0–2.5)
ALT: 32 U/L (ref 9–46)
AST: 23 U/L (ref 10–35)
Albumin: 4.3 g/dL (ref 3.6–5.1)
Alkaline phosphatase (APISO): 63 U/L (ref 35–144)
BUN/Creatinine Ratio: 25 (calc) — ABNORMAL HIGH (ref 6–22)
BUN: 30 mg/dL — ABNORMAL HIGH (ref 7–25)
CO2: 28 mmol/L (ref 20–32)
Calcium: 9.6 mg/dL (ref 8.6–10.3)
Chloride: 106 mmol/L (ref 98–110)
Creat: 1.2 mg/dL (ref 0.70–1.35)
Globulin: 2.6 g/dL (calc) (ref 1.9–3.7)
Glucose, Bld: 101 mg/dL — ABNORMAL HIGH (ref 65–99)
Potassium: 4.4 mmol/L (ref 3.5–5.3)
Sodium: 142 mmol/L (ref 135–146)
Total Bilirubin: 0.7 mg/dL (ref 0.2–1.2)
Total Protein: 6.9 g/dL (ref 6.1–8.1)
eGFR: 66 mL/min/{1.73_m2} (ref >=60)

## 2022-05-09 NOTE — Progress Notes (Signed)
CBC WNL.  CMP stable.

## 2022-05-17 ENCOUNTER — Ambulatory Visit: Payer: Medicare Other | Admitting: Podiatry

## 2022-05-17 ENCOUNTER — Encounter: Payer: Self-pay | Admitting: Podiatry

## 2022-05-17 DIAGNOSIS — M779 Enthesopathy, unspecified: Secondary | ICD-10-CM | POA: Diagnosis not present

## 2022-05-17 DIAGNOSIS — L84 Corns and callosities: Secondary | ICD-10-CM | POA: Diagnosis not present

## 2022-05-17 DIAGNOSIS — M21622 Bunionette of left foot: Secondary | ICD-10-CM

## 2022-05-17 NOTE — Progress Notes (Signed)
Subjective:   Patient ID: [name], male   DOB: 67 y.o.   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  Today I went ahead and I did do debridement of the lesion courtesy I applied a metatarsal pad to offload the area and I want him to use those for the next few weeks and will be seen back as needed

## 2022-05-19 ENCOUNTER — Encounter: Payer: Self-pay | Admitting: Genetic Counselor

## 2022-05-19 ENCOUNTER — Telehealth: Payer: Self-pay | Admitting: Genetic Counselor

## 2022-05-19 ENCOUNTER — Ambulatory Visit: Payer: Self-pay | Admitting: Genetic Counselor

## 2022-05-19 DIAGNOSIS — Z1379 Encounter for other screening for genetic and chromosomal anomalies: Secondary | ICD-10-CM

## 2022-05-19 DIAGNOSIS — Z803 Family history of malignant neoplasm of breast: Secondary | ICD-10-CM

## 2022-05-19 NOTE — Telephone Encounter (Signed)
Revealed negative genetics and VUS in SDHA and SDHB genes.

## 2022-05-19 NOTE — Progress Notes (Signed)
HPI:   [name] was previously seen in the [place] clinic due to a family history of breast cancer and concerns regarding a hereditary predisposition to cancer. Please refer to our prior cancer genetics clinic note for more information regarding our discussion, assessment and recommendations, at the time. [name] recent genetic test results were disclosed to him, as were recommendations warranted by these results. These results and recommendations are discussed in more detail below.  CANCER HISTORY:  Oncology History   No history exists.    FAMILY HISTORY:  We obtained a detailed, 4-generation family history.  Significant diagnoses are listed below:      Family History  Problem Relation Age of Onset   Breast cancer Mother 72   Lung cancer Maternal Uncle          mother's paternal half brother     [name] is unaware of previous family history of genetic testing for hereditary cancer risks. There is no reported Ashkenazi Jewish ancestry. There is no known consanguinity.  GENETIC TEST RESULTS:  The [facility] CancerNext-Expanded +RNAinsight Panel found no pathogenic mutations.   The CancerNext-Expanded gene panel offered by [facility] and includes sequencing, rearrangement, and RNA analysis for the following 77 genes: AIP, ALK, APC, ATM, AXIN2, BAP1, BARD1, BLM, BMPR1A, BRCA1, BRCA2, BRIP1, CDC73, CDH1, CDK4, CDKN1B, CDKN2A, CHEK2, CTNNA1, DICER1, FANCC, FH, FLCN, GALNT12, KIF1B, LZTR1, MAX, MEN1, MET, MLH1, MSH2, MSH3, MSH6, MUTYH, NBN, NF1, NF2, NTHL1, PALB2, PHOX2B, PMS2, POT1, PRKAR1A, PTCH1, PTEN, RAD51C, RAD51D, RB1, RECQL, RET, SDHA, SDHAF2, SDHB, SDHC, SDHD, SMAD4, SMARCA4, SMARCB1, SMARCE1, STK11, SUFU, TMEM127, TP53, TSC1, TSC2, VHL and XRCC2 (sequencing and deletion/duplication); EGFR, EGLN1, HOXB13, KIT, MITF, PDGFRA, POLD1, and POLE (sequencing only); EPCAM and GREM1 (deletion/duplication only).  .   The test report has been scanned into EPIC and is  located under the Molecular Pathology section of the Results Review tab.  A portion of the result report is included below for reference. Genetic testing reported out on May 16, 2022.     Genetic testing identified two variants of uncertain significance (VUS) in the SDHA gene called c.1532T>C (p.L511P) and the other in the SDHB gene at c.478_[id number] (p.K160del).  At this time, it is unknown if these variants are associated with an increased risk for cancer or if it is benign, but most uncertain variants are reclassified to benign. It should not be used to make medical management decisions. With time, we suspect the laboratory will determine the significance of these variants, if any. If the laboratory reclassifies these variants, we will attempt to contact [name] to discuss it further.   Even though a pathogenic variant was not identified, possible explanations for the cancer in the family may include: There may be no hereditary risk for cancer in the family. The cancers in his family may be sporadic/familial or due to other genetic and environmental factors. There may be a gene mutation in one of these genes that current testing methods cannot detect but that chance is small. There could be another gene that has not yet been discovered, or that we have not yet tested, that is responsible for the cancer diagnoses in the family.  It is also possible there is a hereditary cause for the cancer in the family that [name] did not inherit.   Therefore, it is important to remain in touch with cancer genetics in the future so that we can continue to offer [name] the most up to date genetic  testing.     ADDITIONAL GENETIC TESTING:  We discussed with [name] that his genetic testing was fairly extensive.  If there are additional relevant genes identified to increase cancer risk that can be analyzed in the future, we would be happy to discuss and coordinate this testing at that time.       CANCER SCREENING RECOMMENDATIONS:  [name] test result is considered negative (normal).  This means that we have not identified a hereditary cause for his family history of breast cancer at this time.   An individual's cancer risk and medical management are not determined by genetic test results alone. Overall cancer risk assessment incorporates additional factors, including personal medical history, family history, and any available genetic information that may result in a personalized plan for cancer prevention and surveillance. Therefore, it is recommended he continue to follow the cancer management and screening guidelines provided by his primary healthcare provider.  RECOMMENDATIONS FOR FAMILY MEMBERS:   Since he did not inherit a identifiable mutation in a cancer predisposition gene included on this panel, his children could not have inherited a known mutation from him in one of these genes. Individuals in this family might be at some increased risk of developing cancer, over the general population risk, due to the family history of cancer.  Individuals in the family should notify their providers of the family history of cancer. We recommend women in this family have a yearly mammogram beginning at age 54, or 33 years younger than the earliest onset of cancer, an annual clinical breast exam, and perform monthly breast self-exams.   Other members of the family may still carry a pathogenic variant in one of these genes that [name] did not inherit. Based on the family history of breast cancer in his mother before age 53, we recommend his brother have genetic counseling and testing. [name] will let us know if we can be of any assistance in coordinating genetic counseling and/or testing for this family member.   We do not recommend familial testing for the SDHB or SDHA variants of uncertain significance (VUS).  FOLLOW-UP:  Lastly, we discussed with [name] that cancer genetics is  a rapidly advancing field and it is possible that new genetic tests will be appropriate for him and/or his family members in the future. We encouraged him to remain in contact with cancer genetics on an annual basis so we can update his personal and family histories and let him know of advances in cancer genetics that may benefit this family.   Our contact number was provided. Mr. Scheurich questions were answered to his satisfaction, and he knows he is welcome to call us at anytime with additional questions or concerns.   Verle Brillhart M. Joette Catching, [place], [facility] Genetic Counselor [email or web address] (P) [phone]

## 2022-05-26 ENCOUNTER — Ambulatory Visit (INDEPENDENT_AMBULATORY_CARE_PROVIDER_SITE_OTHER): Payer: Self-pay

## 2022-05-26 DIAGNOSIS — B351 Tinea unguium: Secondary | ICD-10-CM

## 2022-05-26 NOTE — Progress Notes (Signed)
Patient presents today for the 5th (2nd round) laser treatment. Diagnosed with mycotic nail infection by Dr. Cannon Kettle.   Toenail most affected all ten nails. Some of the nails are showing new nail growth at the base.  All other systems are negative.  Nails were filed thin. Laser therapy was administered to 1-5 toenails bilateral and patient tolerated the treatment well. All safety precautions were in place.  Follow-up in 8 weeks for Laser #6

## 2022-06-29 DIAGNOSIS — L570 Actinic keratosis: Secondary | ICD-10-CM | POA: Diagnosis not present

## 2022-06-29 DIAGNOSIS — L309 Dermatitis, unspecified: Secondary | ICD-10-CM | POA: Diagnosis not present

## 2022-06-29 DIAGNOSIS — L578 Other skin changes due to chronic exposure to nonionizing radiation: Secondary | ICD-10-CM | POA: Diagnosis not present

## 2022-06-29 DIAGNOSIS — D1801 Hemangioma of skin and subcutaneous tissue: Secondary | ICD-10-CM | POA: Diagnosis not present

## 2022-06-29 DIAGNOSIS — L821 Other seborrheic keratosis: Secondary | ICD-10-CM | POA: Diagnosis not present

## 2022-07-18 ENCOUNTER — Ambulatory Visit (INDEPENDENT_AMBULATORY_CARE_PROVIDER_SITE_OTHER): Payer: Medicare Other

## 2022-07-18 DIAGNOSIS — B351 Tinea unguium: Secondary | ICD-10-CM

## 2022-07-18 NOTE — Patient Instructions (Signed)

## 2022-07-18 NOTE — Progress Notes (Signed)
Patient presents today for the 6th (2nd round) laser treatment. Diagnosed with mycotic nail infection by Dr. Cannon Kettle.   Toenail most affected all ten nails. Some of the nails are showing new nail growth at the base.  All other systems are negative.  Nails were filed thin. Laser therapy was administered to 1-5 toenails bilateral and patient tolerated the treatment well. All safety precautions were in place.  Patient has completed the recommended laser treatments. He will follow up with Dr. Blenda Mounts in 3 months to evaluate progress.

## 2022-07-20 ENCOUNTER — Other Ambulatory Visit: Payer: Medicare Other

## 2022-07-21 ENCOUNTER — Other Ambulatory Visit: Payer: Medicare Other

## 2022-08-02 DIAGNOSIS — K219 Gastro-esophageal reflux disease without esophagitis: Secondary | ICD-10-CM | POA: Diagnosis not present

## 2022-08-02 DIAGNOSIS — E782 Mixed hyperlipidemia: Secondary | ICD-10-CM | POA: Diagnosis not present

## 2022-08-02 DIAGNOSIS — Z79899 Other long term (current) drug therapy: Secondary | ICD-10-CM | POA: Diagnosis not present

## 2022-08-02 DIAGNOSIS — M0600 Rheumatoid arthritis without rheumatoid factor, unspecified site: Secondary | ICD-10-CM | POA: Diagnosis not present

## 2022-08-02 DIAGNOSIS — R5381 Other malaise: Secondary | ICD-10-CM | POA: Diagnosis not present

## 2022-08-02 DIAGNOSIS — R5383 Other fatigue: Secondary | ICD-10-CM | POA: Diagnosis not present

## 2022-08-02 DIAGNOSIS — M159 Polyosteoarthritis, unspecified: Secondary | ICD-10-CM | POA: Diagnosis not present

## 2022-08-02 DIAGNOSIS — F339 Major depressive disorder, recurrent, unspecified: Secondary | ICD-10-CM | POA: Diagnosis not present

## 2022-08-23 NOTE — Progress Notes (Signed)
Office Visit Note  Patient: [name]             Date of Birth: 1955-07-24           MRN: [id number]             PCP: Gordan Payment., MD Referring: Gordan Payment., MD Visit Date: 09/05/2022 Occupation: @GUAROCC @  Subjective:  Right ankle pain  History of Present Illness: [name] is a 67 y.o. male with history of seronegative inflammatory arthritis and osteoarthritis.  He returns today after his last visit in November 2023.  According to him last week he went to [place].  He states he on his feet for about 6 hours on Friday and 9 hours on Saturday night.  On Saturday night he started having pain and swelling in his right ankle joint and was having difficulty walking.  He states the symptoms gradually improved over time.  He has been propping his leg.  He was doing routine activities after he did return back here in [place].  He has been running normal errands today without any discomfort.  None of the other joints are painful.  He is on sulfasalazine 500 mg 2 tablets p.o. twice daily, methotrexate 0.8 mL subcu weekly with folic acid 2 mg daily.  He takes Celebrex  200 mg every other day as needed for pain.  He had taken Celebrex intermittently.  He had no interruption in his medications.    Activities of Daily Living:  Patient reports morning stiffness for 0 minute.   Patient Denies nocturnal pain.  Difficulty dressing/grooming: Denies Difficulty climbing stairs: Denies Difficulty getting out of chair: Denies Difficulty using hands for taps, buttons, cutlery, and/or writing: Denies  Review of Systems  Constitutional:  Negative for fatigue.  HENT:  Negative for mouth sores and mouth dryness.   Eyes:  Negative for dryness.  Respiratory:  Negative for shortness of breath.   Cardiovascular:  Negative for chest pain and palpitations.  Gastrointestinal:  Negative for blood in stool, constipation and diarrhea.  Endocrine: Negative for increased urination.   Genitourinary:  Negative for involuntary urination.  Musculoskeletal:  Positive for gait problem. Negative for joint pain, joint pain, joint swelling, myalgias, muscle weakness, morning stiffness, muscle tenderness and myalgias.  Skin:  Negative for color change, rash, hair loss and sensitivity to sunlight.  Allergic/Immunologic: Negative for susceptible to infections.  Neurological:  Negative for dizziness and headaches.  Hematological:  Negative for swollen glands.  Psychiatric/Behavioral:  Negative for depressed mood and sleep disturbance. The patient is not nervous/anxious.     PMFS History:  Patient Active Problem List   Diagnosis Date Noted   Genetic testing 05/19/2022   Mixed hyperlipidemia 06/25/2020   GERD without esophagitis 07/23/2018   Primary osteoarthritis of both hands 02/05/2018   History of partial knee replacement 02/05/2018   Anxiety and depression 02/05/2018   History of kidney stones 02/05/2018   High risk medication use 02/05/2018   Seronegative Inflammatory arthritis 01/15/2017   Primary osteoarthritis involving multiple joints 08/22/2016   Carpal tunnel syndrome of left wrist 04/13/2016   Chronic recurrent major depressive disorder (HCC) 11/02/2015   Malaise and fatigue 11/02/2015   Seronegative rheumatoid arthritis (HCC) 11/02/2015    Past Medical History:  Diagnosis Date   Arthritis    Hyperthyroidism    Kidney stone     Family History  Problem Relation Age of Onset   Breast cancer Mother 53   Lung cancer Maternal Uncle  mother's paternal half brother   Past Surgical History:  Procedure Laterality Date   ANKLE ARTHROPLASTY Right    1989 right ankle reconstruction   CARPAL TUNNEL RELEASE Bilateral    JOINT REPLACEMENT     right knee partial    JOINT REPLACEMENT     left knee partial   KNEE ARTHROPLASTY     KNEE ARTHROSCOPY     TOTAL SHOULDER ARTHROPLASTY     Social History   Social History Narrative   Not on file   Immunization  History  Administered Date(s) Administered   Comptroller (J&J) SARS-COV-2 Vaccination 10/15/2019   Moderna Sars-Covid-2 Vaccination 05/12/2020   Pneumococcal Conjugate-13 08/28/2016   Pneumococcal Polysaccharide-23 02/26/2017   Td 05/08/2005   Zoster Recombinat (Shingrix) 08/02/2017, 11/30/2017     Objective: Vital Signs: BP 138/74 (BP Location: Left Arm, Patient Position: Sitting, Cuff Size: Normal)   Pulse 76   Resp 14   Ht 5' 10.5" (1.791 m)   Wt 198 lb (89.8 kg)   BMI 28.01 kg/m    Physical Exam Vitals and nursing note reviewed.  Constitutional:      Appearance: He is well-developed.  HENT:     Head: Normocephalic and atraumatic.  Eyes:     Conjunctiva/sclera: Conjunctivae normal.     Pupils: Pupils are equal, round, and reactive to light.  Cardiovascular:     Rate and Rhythm: Normal rate and regular rhythm.     Heart sounds: Normal heart sounds.  Pulmonary:     Effort: Pulmonary effort is normal.     Breath sounds: Normal breath sounds.  Abdominal:     General: Bowel sounds are normal.     Palpations: Abdomen is soft.  Musculoskeletal:     Cervical back: Normal range of motion and neck supple.  Skin:    General: Skin is warm and dry.     Capillary Refill: Capillary refill takes less than 2 seconds.  Neurological:     Mental Status: He is alert and oriented to person, place, and time.  Psychiatric:        Behavior: Behavior normal.      Musculoskeletal Exam: Cervical spine was in good range of motion.  He had good mobility in the thoracic and lumbar spine.  Shoulders, elbows, wrist joints, MCPs PIPs and DIPs with good range of motion.  He had bilateral PIP and DIP thickening with no synovitis.  Hip joints were in good range of motion.  Bilateral knee joints which were partially replaced were also in good range of motion.  He had no warmth or swelling over his right ankle joint.  Surgical scar was noted on the right ankle joint with some thickening.  Left ankle joint  was in good range of motion without any warmth swelling or effusion.  There was no tenderness over ankles or MTPs.  Nail dystrophy was noted.  CDAI Exam: CDAI Score: -- Patient Global: --; Provider Global: -- Swollen: --; Tender: -- Joint Exam 09/05/2022   No joint exam has been documented for this visit   There is currently no information documented on the homunculus. Go to the Rheumatology activity and complete the homunculus joint exam.  Investigation: No additional findings.  Imaging: No results found.  Recent Labs: Lab Results  Component Value Date   WBC 5.1 05/05/2022   HGB 14.9 05/05/2022   PLT 221 05/05/2022   NA 142 05/05/2022   K 4.4 05/05/2022   CL 106 05/05/2022   CO2 28 05/05/2022  GLUCOSE 101 (H) 05/05/2022   BUN 30 (H) 05/05/2022   CREATININE 1.20 05/05/2022   BILITOT 0.7 05/05/2022   AST 23 05/05/2022   ALT 32 05/05/2022   PROT 6.9 05/05/2022   CALCIUM 9.6 05/05/2022   GFRAA 95 10/18/2020   QFTBGOLDPLUS NEGATIVE 06/06/2017    Speciality Comments: No specialty comments available.  Procedures:  No procedures performed Allergies: Nicotine   Assessment / Plan:     Visit Diagnoses: Seronegative Inflammatory arthritis-he had no synovitis on the examination today.  Patient states about 3 days ago he developed pain and swelling in his right ankle joint after standing for multiple hours for 2 days.  The pain was severe and lasted for about 48 hours which gradually improved.  He was taking Celebrex on as needed basis.  He states today the discomfort has been minimal.  He had no warmth or swelling on the examination.  None of the other joints are painful or swollen.  High risk medication use - Sulfasalazine 500 mg 2 tablets by mouth twice daily, methotrexate 0.8 mL subcu as injections once weekly, and folic acid 2 mg daily.  Labs obtained on May 05, 2022 CBC and CMP were normal.  He was advised to get labs every 3 months to monitor for drug toxicity.  Need  for getting labs on a regular basis was emphasized.- Plan: CBC with Differential/Platelet, COMPLETE METABOLIC PANEL WITH GFR today and then every 3 months.  Information on immunization was placed in the AVS.  He was advised to hold sulfasalazine and methotrexate if he develops an infection and resume after the infection resolves.  Primary osteoarthritis of both hands-he had bilateral PIP and DIP thickening.  No synovitis was noted.  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  Acute right ankle pain -he developed pain and swelling about 3 days ago which gradually resolved.  There is no history of gout.  Patient denies drinking alcohol or consuming red meat prior to this episode.  He had left ankle reconstruction surgery in 1989 by Dr. Terrilee Croak.  He had surgical scar and thickening.  I am uncertain about the etiology of the swelling and discomfort.  Will obtain following labs today.  Will contact him after the lab results are available.  Plan: Sedimentation rate, Uric acid  Anxiety and depression  History of kidney stones  Orders: Orders Placed This Encounter  Procedures   CBC with Differential/Platelet   COMPLETE METABOLIC PANEL WITH GFR   Sedimentation rate   Uric acid   No orders of the defined types were placed in this encounter.    Follow-Up Instructions: Return in about 5 months (around 02/05/2023) for Inflammatory arthritis.   [name], MD  Note - This record has been created using Animal nutritionist.  Chart creation errors have been sought, but may not always  have been located. Such creation errors do not reflect on  the standard of medical care.

## 2022-09-05 ENCOUNTER — Encounter: Payer: Self-pay | Admitting: Rheumatology

## 2022-09-05 ENCOUNTER — Ambulatory Visit: Payer: Medicare Other | Attending: Rheumatology | Admitting: Rheumatology

## 2022-09-05 VITALS — BP 138/74 | HR 76 | Resp 14 | Ht 70.5 in | Wt 198.0 lb

## 2022-09-05 DIAGNOSIS — M1990 Unspecified osteoarthritis, unspecified site: Secondary | ICD-10-CM

## 2022-09-05 DIAGNOSIS — M19041 Primary osteoarthritis, right hand: Secondary | ICD-10-CM | POA: Diagnosis not present

## 2022-09-05 DIAGNOSIS — Z79899 Other long term (current) drug therapy: Secondary | ICD-10-CM | POA: Diagnosis not present

## 2022-09-05 DIAGNOSIS — M25571 Pain in right ankle and joints of right foot: Secondary | ICD-10-CM

## 2022-09-05 DIAGNOSIS — G5602 Carpal tunnel syndrome, left upper limb: Secondary | ICD-10-CM | POA: Diagnosis not present

## 2022-09-05 DIAGNOSIS — M19042 Primary osteoarthritis, left hand: Secondary | ICD-10-CM

## 2022-09-05 DIAGNOSIS — F32A Depression, unspecified: Secondary | ICD-10-CM

## 2022-09-05 DIAGNOSIS — Z96659 Presence of unspecified artificial knee joint: Secondary | ICD-10-CM

## 2022-09-05 DIAGNOSIS — F419 Anxiety disorder, unspecified: Secondary | ICD-10-CM

## 2022-09-05 DIAGNOSIS — Z87442 Personal history of urinary calculi: Secondary | ICD-10-CM

## 2022-09-05 NOTE — Patient Instructions (Signed)
Standing Labs We placed an order today for your standing lab work.   Please have your standing labs drawn in July and every 3 months  Please have your labs drawn 2 weeks prior to your appointment so that the provider can discuss your lab results at your appointment, if possible.  Please note that you may see your imaging and lab results in MyChart before we have reviewed them. We will contact you once all results are reviewed. Please allow our office up to 72 hours to thoroughly review all of the results before contacting the office for clarification of your results.  WALK-IN LAB HOURS  Monday through Thursday from 8:00 am -12:30 pm and 1:00 pm-5:00 pm and Friday from 8:00 am-12:00 pm.  Patients with office visits requiring labs will be seen before walk-in labs.  You may encounter longer than normal wait times. Please allow additional time. Wait times may be shorter on  Monday and Thursday afternoons.  We do not book appointments for walk-in labs. We appreciate your patience and understanding with our staff.   Labs are drawn by [facility]. Please bring your co-pay at the time of your lab draw.  You may receive a bill from [facility] for your lab work.  Please note if you are on Hydroxychloroquine and and an order has been placed for a Hydroxychloroquine level,  you will need to have it drawn 4 hours or more after your last dose.  If you wish to have your labs drawn at another location, please call the office 24 hours in advance so we can fax the orders.  The office is located at [address], [address], [place], [place] [address]   If you have any questions regarding directions or hours of operation,  please call [phone].   As a reminder, please drink plenty of water prior to coming for your lab work. Thanks!   Vaccines You are taking a medication(s) that can suppress your immune system.  The following immunizations are recommended: Flu annually Covid-19  Td/Tdap (tetanus, diphtheria,  pertussis) every 10 years Pneumonia (Prevnar 15 then Pneumovax 23 at least 1 year apart.  Alternatively, can take Prevnar 20 without needing additional dose) Shingrix: 2 doses from 4 weeks to 6 months apart  Please check with your PCP to make sure you are up to date.   If you have signs or symptoms of an infection or start antibiotics: First, call your PCP for workup of your infection. Hold your medication through the infection, until you complete your antibiotics, and until symptoms resolve if you take the following: Injectable medication (Actemra, Benlysta, Cimzia, Cosentyx, Enbrel, Humira, Kevzara, Orencia, Remicade, Simponi, Stelara, Taltz, Tremfya) Methotrexate Leflunomide (Arava) Mycophenolate (Cellcept) Xeljanz, Olumiant, or Rinvoq  

## 2022-09-06 LAB — CBC WITH DIFFERENTIAL/PLATELET
Absolute Monocytes: 653 cells/uL (ref 200–950)
Basophils Absolute: 48 cells/uL (ref 0–200)
Basophils Relative: 0.7 %
Eosinophils Absolute: 129 cells/uL (ref 15–500)
Eosinophils Relative: 1.9 %
HCT: 42.5 % (ref 38.5–50.0)
Hemoglobin: 14.4 g/dL (ref 13.2–17.1)
Lymphs Abs: 1639 cells/uL (ref 850–3900)
MCH: 31.9 pg (ref 27.0–33.0)
MCHC: 33.9 g/dL (ref 32.0–36.0)
MCV: 94 fL (ref 80.0–100.0)
MPV: 10.6 fL (ref 7.5–12.5)
Monocytes Relative: 9.6 %
Neutro Abs: 4332 cells/uL (ref 1500–7800)
Neutrophils Relative %: 63.7 %
Platelets: 250 10*3/uL (ref 140–400)
RBC: 4.52 10*6/uL (ref 4.20–5.80)
RDW: 13.1 % (ref 11.0–15.0)
Total Lymphocyte: 24.1 %
WBC: 6.8 10*3/uL (ref 3.8–10.8)

## 2022-09-06 LAB — SEDIMENTATION RATE: Sed Rate: 25 mm/h — ABNORMAL HIGH (ref 0–20)

## 2022-09-06 LAB — COMPLETE METABOLIC PANEL WITH GFR
AG Ratio: 1.9 (calc) (ref 1.0–2.5)
ALT: 28 U/L (ref 9–46)
AST: 19 U/L (ref 10–35)
Albumin: 4.3 g/dL (ref 3.6–5.1)
Alkaline phosphatase (APISO): 68 U/L (ref 35–144)
BUN: 21 mg/dL (ref 7–25)
CO2: 29 mmol/L (ref 20–32)
Calcium: 9.1 mg/dL (ref 8.6–10.3)
Chloride: 106 mmol/L (ref 98–110)
Creat: 1.14 mg/dL (ref 0.70–1.35)
Globulin: 2.3 g/dL (calc) (ref 1.9–3.7)
Glucose, Bld: 81 mg/dL (ref 65–99)
Potassium: 3.9 mmol/L (ref 3.5–5.3)
Sodium: 143 mmol/L (ref 135–146)
Total Bilirubin: 1 mg/dL (ref 0.2–1.2)
Total Protein: 6.6 g/dL (ref 6.1–8.1)
eGFR: 70 mL/min/{1.73_m2} (ref >=60)

## 2022-09-06 LAB — URIC ACID: Uric Acid, Serum: 6.3 mg/dL (ref 4.0–8.0)

## 2022-09-06 NOTE — Progress Notes (Signed)
CBC and CMP are normal.  Uric acid (test for gout) is 6.3 which is not elevated.  Sedimentation rate is mildly elevated at 25.  No change in treatment advised.

## 2022-10-11 ENCOUNTER — Ambulatory Visit: Payer: Medicare Other | Admitting: Podiatry

## 2022-10-11 DIAGNOSIS — M79674 Pain in right toe(s): Secondary | ICD-10-CM | POA: Diagnosis not present

## 2022-10-11 DIAGNOSIS — B351 Tinea unguium: Secondary | ICD-10-CM

## 2022-10-11 DIAGNOSIS — M79675 Pain in left toe(s): Secondary | ICD-10-CM

## 2022-10-11 MED ORDER — EFINACONAZOLE 10 % EX SOLN
1.0000 [drp] | Freq: Every day | CUTANEOUS | 2 refills | Status: DC
Start: 2022-10-11 — End: 2022-12-06

## 2022-10-13 ENCOUNTER — Ambulatory Visit: Payer: Medicare Other | Admitting: Podiatry

## 2022-10-13 ENCOUNTER — Encounter: Payer: Self-pay | Admitting: Podiatry

## 2022-10-13 NOTE — Progress Notes (Signed)
       Subjective:  Patient ID: [name], male    DOB: Apr 02, 1956,  MRN: [id number]   [name] presents to clinic today for:  Chief Complaint  Patient presents with   Nail Problem    Rm 1 Follow up bilateral nail fungus. Pt requested to start back on Jubilee.   . Patient notes nails are thick, discolored, elongated and painful in shoegear when trying to ambulate.  Patient has had 2 rounds of laser treatments to the toenails.  He does note that there is some improvement of the nails near the proximal nail fold.  He also had previously done a round of formula 7, as well as Jublia.  He would like to resume Jublia if possible.  PCP is Gordan Payment., MD.  Allergies  Allergen Reactions   Nicotine     Other reaction(s): Other (see comments)    Review of Systems: Negative except as noted in the HPI.  Objective:  There were no vitals filed for this visit.  [name] is a pleasant 67 y.o. male in NAD. AAO x 3.  Vascular Examination: Capillary refill time is 3-5 seconds to toes bilateral. Palpable pedal pulses b/l LE. Digital hair present b/l. No pedal edema b/l. Skin temperature gradient WNL b/l. No varicosities b/l. No cyanosis or clubbing noted b/l.   Dermatological Examination: Pedal skin with normal turgor, texture and tone b/l. No open wounds. No interdigital macerations b/l. Toenails x10 are 3mm thick, discolored, dystrophic with subungual debris. There is pain with compression of the nail plates.  They are elongated x10.  There is clear healthy nail, which is not thick, along the proximal 40% of the toenails.   Assessment/Plan: 1. Pain due to onychomycosis of toenails of both feet     Meds ordered this encounter  Medications   Efinaconazole 10 % SOLN    Sig: Apply 1 drop topically daily.    Dispense:  4 mL    Refill:  2   The mycotic toenails were sharply debrided x10 with sterile nail nippers and a power debriding burr to decrease bulk/thickness and  length.  A new prescription for Jublia solution was sent to his pharmacy.  He will use this once daily on all toenails.  Follow-up in 3 months or as needed for fungal nail recheck.  Clerance Lav, DPM, FACFAS [facility]     [address] [place], [place] [address]                Office [phone]  Fax [phone]

## 2022-10-18 ENCOUNTER — Ambulatory Visit: Payer: Medicare Other | Admitting: Podiatry

## 2022-12-06 ENCOUNTER — Other Ambulatory Visit: Payer: Self-pay | Admitting: Podiatry

## 2022-12-06 DIAGNOSIS — B351 Tinea unguium: Secondary | ICD-10-CM

## 2022-12-06 MED ORDER — EFINACONAZOLE 10 % EX SOLN
1.0000 [drp] | Freq: Every day | CUTANEOUS | 2 refills | Status: AC
Start: 2022-12-06 — End: ?

## 2022-12-06 NOTE — Progress Notes (Signed)
Patient requested that his prescription Jublia be sent to [facility] instead of the original pharmacy was sent to.  His choice pharmacy was entered into the system this was sent over today.

## 2023-01-24 NOTE — Progress Notes (Unsigned)
Office Visit Note  Patient: [name]             Date of Birth: 11-22-1955           MRN: [id number]             PCP: Gordan Payment., MD Referring: Gordan Payment., MD Visit Date: 02/06/2023 Occupation: @GUAROCC @  Subjective:  Medication monitoring   History of Present Illness: ONEY DUTSON is a 67 y.o. male with history of seronegative inflammatory arthritis and osteoarthritis.  He remains on Sulfasalazine 500 mg 2 tablets by mouth twice daily, methotrexate 0.8 mL sq injections once weekly, and folic acid 2 mg daily.  He is tolerating combination therapy without any side effects and has not missed any doses recently.  He denies any signs or symptoms of an inflammatory arthritis flare.  He has been taking Celebrex 200 mg 1 capsule daily for pain relief.  He denies any joint swelling at this time.  He has not had any morning stiffness or nocturnal pain.  He denies any gaps in therapy.  He denies any new medical conditions.  He denies any recent or recurrent infections.   Activities of Daily Living:  Patient reports morning stiffness for 0 minutes.   Patient Denies nocturnal pain.  Difficulty dressing/grooming: Reports Difficulty climbing stairs: Denies Difficulty getting out of chair: Denies Difficulty using hands for taps, buttons, cutlery, and/or writing: Denies  Review of Systems  Constitutional:  Negative for fatigue.  HENT:  Negative for mouth sores and mouth dryness.   Eyes:  Negative for dryness.  Respiratory:  Negative for shortness of breath.   Cardiovascular:  Negative for chest pain and palpitations.  Gastrointestinal:  Negative for blood in stool, constipation and diarrhea.  Endocrine: Negative for increased urination.  Genitourinary:  Negative for involuntary urination.  Musculoskeletal:  Positive for myalgias and myalgias. Negative for joint pain, gait problem, joint pain, joint swelling, muscle weakness, morning stiffness and muscle tenderness.  Skin:   Negative for color change, rash, hair loss and sensitivity to sunlight.  Allergic/Immunologic: Negative for susceptible to infections.  Neurological:  Negative for dizziness and headaches.  Hematological:  Negative for swollen glands.  Psychiatric/Behavioral:  Negative for depressed mood and sleep disturbance. The patient is not nervous/anxious.     PMFS History:  Patient Active Problem List   Diagnosis Date Noted   Genetic testing 05/19/2022   Mixed hyperlipidemia 06/25/2020   GERD without esophagitis 07/23/2018   Primary osteoarthritis of both hands 02/05/2018   History of prosthetic unicompartmental arthroplasty of left knee 02/05/2018   Anxiety and depression 02/05/2018   History of kidney stones 02/05/2018   High risk medication use 02/05/2018   Seronegative Inflammatory arthritis 01/15/2017   Primary osteoarthritis involving multiple joints 08/22/2016   Carpal tunnel syndrome of left wrist 04/13/2016   Chronic recurrent major depressive disorder (HCC) 11/02/2015   Malaise and fatigue 11/02/2015   Seronegative rheumatoid arthritis (HCC) 11/02/2015    Past Medical History:  Diagnosis Date   Arthritis    Hyperthyroidism    Kidney stone     Family History  Problem Relation Age of Onset   Breast cancer Mother 77   Lung cancer Maternal Uncle        mother's paternal half brother   Past Surgical History:  Procedure Laterality Date   ANKLE ARTHROPLASTY Right    1989 right ankle reconstruction   CARPAL TUNNEL RELEASE Bilateral    JOINT REPLACEMENT  right knee partial    JOINT REPLACEMENT     left knee partial   KNEE ARTHROPLASTY     KNEE ARTHROSCOPY     TOTAL SHOULDER ARTHROPLASTY     Social History   Social History Narrative   Not on file   Immunization History  Administered Date(s) Administered   Comptroller (J&J) SARS-COV-2 Vaccination 10/15/2019   Moderna Sars-Covid-2 Vaccination 05/12/2020   Pneumococcal Conjugate-13 08/28/2016   Pneumococcal  Polysaccharide-23 02/26/2017   Td 05/08/2005   Zoster Recombinant(Shingrix) 08/02/2017, 11/30/2017     Objective: Vital Signs: BP (!) 157/98 (BP Location: Left Arm, Patient Position: Sitting, Cuff Size: Small)   Pulse 68   Resp 12   Ht 5\' 10"  (1.778 m)   Wt 199 lb (90.3 kg)   BMI 28.55 kg/m    Physical Exam Vitals and nursing note reviewed.  Constitutional:      Appearance: He is well-developed.  HENT:     Head: Normocephalic and atraumatic.  Eyes:     Conjunctiva/sclera: Conjunctivae normal.     Pupils: Pupils are equal, round, and reactive to light.  Cardiovascular:     Rate and Rhythm: Normal rate and regular rhythm.     Heart sounds: Normal heart sounds.  Pulmonary:     Effort: Pulmonary effort is normal.     Breath sounds: Normal breath sounds.  Abdominal:     General: Bowel sounds are normal.     Palpations: Abdomen is soft.  Musculoskeletal:     Cervical back: Normal range of motion and neck supple.  Skin:    General: Skin is warm and dry.     Capillary Refill: Capillary refill takes less than 2 seconds.  Neurological:     Mental Status: He is alert and oriented to person, place, and time.  Psychiatric:        Behavior: Behavior normal.      Musculoskeletal Exam: C-spine has good range of motion.  Shoulder joints, elbow joints, wrist joints, MCPs, PIPs, DIPs have good range of motion with no synovitis.  Complete fist formation bilaterally.  PIP and DIP thickening consistent with osteoarthritis of both hands.  Hip joints have good range of motion with no groin pain.  Bilateral partial knee replacements have good range of motion.  Ankle joints have good range of motion with no tenderness or joint swelling.  Mild thickening over the right ankle.  CDAI Exam: CDAI Score: -- Patient Global: 20 / 100; Provider Global: 20 / 100 Swollen: --; Tender: -- Joint Exam 02/06/2023   No joint exam has been documented for this visit   There is currently no information  documented on the homunculus. Go to the Rheumatology activity and complete the homunculus joint exam.  Investigation: No additional findings.  Imaging: No results found.  Recent Labs: Lab Results  Component Value Date   WBC 6.8 09/05/2022   HGB 14.4 09/05/2022   PLT 250 09/05/2022   NA 143 09/05/2022   K 3.9 09/05/2022   CL 106 09/05/2022   CO2 29 09/05/2022   GLUCOSE 81 09/05/2022   BUN 21 09/05/2022   CREATININE 1.14 09/05/2022   BILITOT 1.0 09/05/2022   AST 19 09/05/2022   ALT 28 09/05/2022   PROT 6.6 09/05/2022   CALCIUM 9.1 09/05/2022   GFRAA 95 10/18/2020   QFTBGOLDPLUS NEGATIVE 06/06/2017    Speciality Comments: No specialty comments available.  Procedures:  No procedures performed Allergies: Nicotine   .  Assessment / Plan:  Visit Diagnoses: Seronegative Inflammatory arthritis: He has no joint tenderness or synovitis on examination today.  He has not had any signs or symptoms of a flare.  He has clinically been doing well taking sulfasalazine 500 mg 2 tablets by mouth twice daily and methotrexate 0.8 mL subcu injections once weekly.  He has been tolerating combination therapy without any side effects or injection site reactions from methotrexate.  He has not had any interruptions in therapy.  He has not been experiencing any morning stiffness, nocturnal pain, or difficulty with ADLs.  He has been taking Celebrex 200 mg 1 capsule by mouth daily for pain relief.  He will remain on combination therapy as prescribed.  He was advised to notify us if he develops signs or symptoms of a flare.  He will follow-up in the office in 5 months or sooner if needed.  High risk medication use - Sulfasalazine 500 mg 2 tablets by mouth twice daily, methotrexate 0.8 mL sq injections once weekly, and folic acid 2 mg daily. CBC and CMP updated on 09/05/22. Orders for CBC and CMP released today.   Discussed the importance of holding methotrexate and sulfasalazine if he develops signs or  symptoms of an infection and to resume once the infection has completely cleared  - Plan: COMPLETE METABOLIC PANEL WITH GFR, CBC with Differential/Platelet  Primary osteoarthritis of both hands: PIP and DIP thickening consistent with osteoarthritis of both hands.  No synovitis noted.  Complete fist formation bilaterally.  He has some difficulty opening jars and uses dryer grippers for assistance.  Carpal tunnel syndrome of left wrist: Asymptomatic at this time.   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  Good ROM with no discomfort.    Other medical conditions are listed as follows:   Anxiety and depression  History of kidney stones  Acute right ankle pain - left ankle reconstruction surgery in 1989 by Dr. Terrilee Croak.Uric acid 6.3 on 09/05/22. ESR borderline elevated-25-stable.    Orders: Orders Placed This Encounter  Procedures   COMPLETE METABOLIC PANEL WITH GFR   CBC with Differential/Platelet   No orders of the defined types were placed in this encounter.   Follow-Up Instructions: Return in about 5 months (around 07/07/2023) for Chronic inflammatory arthritis , Osteoarthritis.   Gearldine Bienenstock, PA-C  Note - This record has been created using Dragon software.  Chart creation errors have been sought, but may not always  have been located. Such creation errors do not reflect on  the standard of medical care.

## 2023-02-06 ENCOUNTER — Ambulatory Visit: Payer: Medicare Other | Attending: Physician Assistant | Admitting: Physician Assistant

## 2023-02-06 ENCOUNTER — Encounter: Payer: Self-pay | Admitting: Physician Assistant

## 2023-02-06 VITALS — BP 157/98 | HR 68 | Resp 12 | Ht 70.0 in | Wt 199.0 lb

## 2023-02-06 DIAGNOSIS — Z87442 Personal history of urinary calculi: Secondary | ICD-10-CM

## 2023-02-06 DIAGNOSIS — G5602 Carpal tunnel syndrome, left upper limb: Secondary | ICD-10-CM | POA: Diagnosis not present

## 2023-02-06 DIAGNOSIS — M19041 Primary osteoarthritis, right hand: Secondary | ICD-10-CM

## 2023-02-06 DIAGNOSIS — M25571 Pain in right ankle and joints of right foot: Secondary | ICD-10-CM

## 2023-02-06 DIAGNOSIS — Z79899 Other long term (current) drug therapy: Secondary | ICD-10-CM

## 2023-02-06 DIAGNOSIS — M1990 Unspecified osteoarthritis, unspecified site: Secondary | ICD-10-CM

## 2023-02-06 DIAGNOSIS — Z96659 Presence of unspecified artificial knee joint: Secondary | ICD-10-CM

## 2023-02-06 DIAGNOSIS — M19042 Primary osteoarthritis, left hand: Secondary | ICD-10-CM

## 2023-02-06 DIAGNOSIS — F32A Depression, unspecified: Secondary | ICD-10-CM

## 2023-02-06 DIAGNOSIS — F419 Anxiety disorder, unspecified: Secondary | ICD-10-CM

## 2023-02-06 NOTE — Patient Instructions (Signed)

## 2023-02-07 LAB — CBC WITH DIFFERENTIAL/PLATELET
Absolute Monocytes: 696 {cells}/uL (ref 200–950)
Basophils Absolute: 48 {cells}/uL (ref 0–200)
Basophils Relative: 0.8 %
Eosinophils Absolute: 132 {cells}/uL (ref 15–500)
Eosinophils Relative: 2.2 %
HCT: 44.6 % (ref 38.5–50.0)
Hemoglobin: 14.8 g/dL (ref 13.2–17.1)
Lymphs Abs: 1650 {cells}/uL (ref 850–3900)
MCH: 32.4 pg (ref 27.0–33.0)
MCHC: 33.2 g/dL (ref 32.0–36.0)
MCV: 97.6 fL (ref 80.0–100.0)
MPV: 10.6 fL (ref 7.5–12.5)
Monocytes Relative: 11.6 %
Neutro Abs: 3474 {cells}/uL (ref 1500–7800)
Neutrophils Relative %: 57.9 %
Platelets: 224 10*3/uL (ref 140–400)
RBC: 4.57 10*6/uL (ref 4.20–5.80)
RDW: 13.2 % (ref 11.0–15.0)
Total Lymphocyte: 27.5 %
WBC: 6 10*3/uL (ref 3.8–10.8)

## 2023-02-07 LAB — COMPLETE METABOLIC PANEL WITH GFR
AG Ratio: 1.9 (calc) (ref 1.0–2.5)
ALT: 21 U/L (ref 9–46)
AST: 20 U/L (ref 10–35)
Albumin: 4.3 g/dL (ref 3.6–5.1)
Alkaline phosphatase (APISO): 65 U/L (ref 35–144)
BUN: 18 mg/dL (ref 7–25)
CO2: 29 mmol/L (ref 20–32)
Calcium: 9.4 mg/dL (ref 8.6–10.3)
Chloride: 104 mmol/L (ref 98–110)
Creat: 1.14 mg/dL (ref 0.70–1.35)
Globulin: 2.3 g/dL (ref 1.9–3.7)
Glucose, Bld: 99 mg/dL (ref 65–99)
Potassium: 4.3 mmol/L (ref 3.5–5.3)
Sodium: 141 mmol/L (ref 135–146)
Total Bilirubin: 1 mg/dL (ref 0.2–1.2)
Total Protein: 6.6 g/dL (ref 6.1–8.1)
eGFR: 70 mL/min/{1.73_m2} (ref >=60)

## 2023-02-07 NOTE — Progress Notes (Signed)
CBC and CMP WNL.

## 2023-02-19 DIAGNOSIS — R5381 Other malaise: Secondary | ICD-10-CM | POA: Diagnosis not present

## 2023-02-19 DIAGNOSIS — M0600 Rheumatoid arthritis without rheumatoid factor, unspecified site: Secondary | ICD-10-CM | POA: Diagnosis not present

## 2023-02-19 DIAGNOSIS — M150 Primary generalized (osteo)arthritis: Secondary | ICD-10-CM | POA: Diagnosis not present

## 2023-02-19 DIAGNOSIS — Z125 Encounter for screening for malignant neoplasm of prostate: Secondary | ICD-10-CM | POA: Diagnosis not present

## 2023-02-19 DIAGNOSIS — K219 Gastro-esophageal reflux disease without esophagitis: Secondary | ICD-10-CM | POA: Diagnosis not present

## 2023-02-19 DIAGNOSIS — R5383 Other fatigue: Secondary | ICD-10-CM | POA: Diagnosis not present

## 2023-02-19 DIAGNOSIS — Z0000 Encounter for general adult medical examination without abnormal findings: Secondary | ICD-10-CM | POA: Diagnosis not present

## 2023-03-01 DIAGNOSIS — K080 Exfoliation of teeth due to systemic causes: Secondary | ICD-10-CM | POA: Diagnosis not present

## 2023-04-26 ENCOUNTER — Other Ambulatory Visit: Payer: Self-pay | Admitting: *Deleted

## 2023-04-26 DIAGNOSIS — Z79899 Other long term (current) drug therapy: Secondary | ICD-10-CM

## 2023-04-27 LAB — CBC WITH DIFFERENTIAL/PLATELET
Absolute Lymphocytes: 1740 {cells}/uL (ref 850–3900)
Absolute Monocytes: 576 {cells}/uL (ref 200–950)
Basophils Absolute: 60 {cells}/uL (ref 0–200)
Basophils Relative: 1 %
Eosinophils Absolute: 120 {cells}/uL (ref 15–500)
Eosinophils Relative: 2 %
HCT: 43.1 % (ref 38.5–50.0)
Hemoglobin: 14.4 g/dL (ref 13.2–17.1)
MCH: 31.9 pg (ref 27.0–33.0)
MCHC: 33.4 g/dL (ref 32.0–36.0)
MCV: 95.6 fL (ref 80.0–100.0)
MPV: 10.9 fL (ref 7.5–12.5)
Monocytes Relative: 9.6 %
Neutro Abs: 3504 {cells}/uL (ref 1500–7800)
Neutrophils Relative %: 58.4 %
Platelets: 221 10*3/uL (ref 140–400)
RBC: 4.51 10*6/uL (ref 4.20–5.80)
RDW: 12.9 % (ref 11.0–15.0)
Total Lymphocyte: 29 %
WBC: 6 10*3/uL (ref 3.8–10.8)

## 2023-04-27 LAB — COMPLETE METABOLIC PANEL WITH GFR
AG Ratio: 2.3 (calc) (ref 1.0–2.5)
ALT: 20 U/L (ref 9–46)
AST: 17 U/L (ref 10–35)
Albumin: 4.4 g/dL (ref 3.6–5.1)
Alkaline phosphatase (APISO): 65 U/L (ref 35–144)
BUN: 22 mg/dL (ref 7–25)
CO2: 30 mmol/L (ref 20–32)
Calcium: 8.9 mg/dL (ref 8.6–10.3)
Chloride: 105 mmol/L (ref 98–110)
Creat: 1.04 mg/dL (ref 0.70–1.35)
Globulin: 1.9 g/dL (ref 1.9–3.7)
Glucose, Bld: 105 mg/dL — ABNORMAL HIGH (ref 65–99)
Potassium: 4 mmol/L (ref 3.5–5.3)
Sodium: 142 mmol/L (ref 135–146)
Total Bilirubin: 0.8 mg/dL (ref 0.2–1.2)
Total Protein: 6.3 g/dL (ref 6.1–8.1)
eGFR: 78 mL/min/{1.73_m2} (ref >=60)

## 2023-04-27 NOTE — Progress Notes (Signed)
CBC and CMP are normal.

## 2023-06-25 NOTE — Telephone Encounter (Signed)
 sulfaSALAzine  (AZULFIDINE  EN-TABS) 500 mg tablet   Patient requested refill. He would like a 3 months supply if possible. Uses [facility]   Please advise.

## 2023-06-26 DIAGNOSIS — L570 Actinic keratosis: Secondary | ICD-10-CM | POA: Diagnosis not present

## 2023-06-26 DIAGNOSIS — L821 Other seborrheic keratosis: Secondary | ICD-10-CM | POA: Diagnosis not present

## 2023-06-26 DIAGNOSIS — L814 Other melanin hyperpigmentation: Secondary | ICD-10-CM | POA: Diagnosis not present

## 2023-06-26 DIAGNOSIS — D229 Melanocytic nevi, unspecified: Secondary | ICD-10-CM | POA: Diagnosis not present

## 2023-06-26 DIAGNOSIS — L578 Other skin changes due to chronic exposure to nonionizing radiation: Secondary | ICD-10-CM | POA: Diagnosis not present

## 2023-06-26 NOTE — Progress Notes (Signed)
 Office Visit Note  Patient: [name]             Date of Birth: 09-19-1955           MRN: [id number]             PCP: Gordan Payment., MD Referring: Gordan Payment., MD Visit Date: 07/10/2023 Occupation: @GUAROCC @  Subjective:  Frequent falls   History of Present Illness: [name] is a 68 y.o. 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  Patient states that he has been experiencing weakness in his lower extremities.  He states his balance is not as good and he has difficulty getting up from the floor.  He also had 2 or 3 episodes of falls after tripping in the last 2 months.  He has been walking on a regular basis.  He is also quite active in the house.  He has been having some discomfort in his right index finger.  He had no recurrence of carpal tunnel syndrome.  He still takes Celebrex 200 mg daily as needed for pain relief.  He continues to be on methotrexate 0.8 mL subcu weekly and sulfasalazine 200 mg p.o. twice daily along with folic acid.    Activities of Daily Living:  Patient reports morning stiffness for 0 minute.   Patient Denies nocturnal pain.  Difficulty dressing/grooming: Denies Difficulty climbing stairs: Denies Difficulty getting out of chair: Reports Difficulty using hands for taps, buttons, cutlery, and/or writing: Denies  Review of Systems  Constitutional:  Negative for fatigue.  HENT:  Negative for mouth sores and mouth dryness.   Eyes:  Negative for dryness.  Respiratory:  Negative for shortness of breath.   Cardiovascular:  Negative for chest pain and palpitations.  Gastrointestinal:  Negative for blood in stool, constipation and diarrhea.  Endocrine: Negative for increased urination.  Genitourinary:  Negative for involuntary urination.  Musculoskeletal:  Negative for joint pain, gait problem, joint pain, joint swelling, myalgias, muscle weakness, morning stiffness, muscle tenderness and myalgias.  Skin:  Positive  for sensitivity to sunlight. Negative for color change, rash and hair loss.  Allergic/Immunologic: Negative for susceptible to infections.  Neurological:  Negative for dizziness and headaches.  Hematological:  Negative for swollen glands.  Psychiatric/Behavioral:  Positive for sleep disturbance. Negative for depressed mood. The patient is not nervous/anxious.     PMFS History:  Patient Active Problem List   Diagnosis Date Noted   Genetic testing 05/19/2022   Mixed hyperlipidemia 06/25/2020   GERD without esophagitis 07/23/2018   Primary osteoarthritis of both hands 02/05/2018   History of prosthetic unicompartmental arthroplasty of left knee 02/05/2018   Anxiety and depression 02/05/2018   History of kidney stones 02/05/2018   High risk medication use 02/05/2018   Seronegative Inflammatory arthritis 01/15/2017   Primary osteoarthritis involving multiple joints 08/22/2016   Carpal tunnel syndrome of left wrist 04/13/2016   Chronic recurrent major depressive disorder (HCC) 11/02/2015   Malaise and fatigue 11/02/2015   Seronegative rheumatoid arthritis (HCC) 11/02/2015    Past Medical History:  Diagnosis Date   Arthritis    Hyperthyroidism    Kidney stone     Family History  Problem Relation Age of Onset   Breast cancer Mother 14   Lung cancer Maternal Uncle        mother's paternal half brother   Past Surgical History:  Procedure Laterality Date   ANKLE ARTHROPLASTY Right    1989 right ankle reconstruction   CARPAL TUNNEL  RELEASE Bilateral    JOINT REPLACEMENT     right knee partial    JOINT REPLACEMENT     left knee partial   KNEE ARTHROPLASTY     KNEE ARTHROSCOPY     TOTAL SHOULDER ARTHROPLASTY     Social History   Social History Narrative   Not on file   Immunization History  Administered Date(s) Administered   Comptroller (J&J) SARS-COV-2 Vaccination 10/15/2019   Moderna Covid-19 Vaccine Bivalent Booster 43yrs & up 03/25/2021   Moderna Sars-Covid-2 Vaccination  05/12/2020   Pneumococcal Conjugate-13 08/28/2016   Pneumococcal Polysaccharide-23 02/26/2017   Td 05/08/2005   Zoster Recombinant(Shingrix) 08/02/2017, 11/30/2017     Objective: Vital Signs: BP (!) 156/88 (BP Location: Left Arm, Patient Position: Sitting, Cuff Size: Large)   Pulse 61   Resp 14   Ht 5\' 10"  (1.778 m)   Wt 198 lb (89.8 kg)   BMI 28.41 kg/m    Physical Exam Vitals and nursing note reviewed.  Constitutional:      Appearance: He is well-developed.  HENT:     Head: Normocephalic and atraumatic.  Eyes:     Conjunctiva/sclera: Conjunctivae normal.     Pupils: Pupils are equal, round, and reactive to light.  Cardiovascular:     Rate and Rhythm: Normal rate and regular rhythm.     Heart sounds: Normal heart sounds.  Pulmonary:     Effort: Pulmonary effort is normal.     Breath sounds: Normal breath sounds.  Abdominal:     General: Bowel sounds are normal.     Palpations: Abdomen is soft.  Musculoskeletal:     Cervical back: Normal range of motion and neck supple.  Skin:    General: Skin is warm and dry.     Capillary Refill: Capillary refill takes less than 2 seconds.  Neurological:     Mental Status: He is alert and oriented to person, place, and time.  Psychiatric:        Behavior: Behavior normal.      Musculoskeletal Exam: Cervical spine with good range of motion.  Shoulders, elbows, wrist joints, MCPs PIPs and DIPs were in good range of motion with no synovitis.  Hip joints and knee joints in good range of motion with no synovitis.  There was no tenderness over ankles or MTPs.  He had limited range of motion of his right ankle due to previous surgery.  CDAI Exam: CDAI Score: -- Patient Global: --; Provider Global: -- Swollen: --; Tender: -- Joint Exam 07/10/2023   No joint exam has been documented for this visit   There is currently no information documented on the homunculus. Go to the Rheumatology activity and complete the homunculus joint  exam.  Investigation: No additional findings.  Imaging: No results found.  Recent Labs: Lab Results  Component Value Date   WBC 6.0 04/26/2023   HGB 14.4 04/26/2023   PLT 221 04/26/2023   NA 142 04/26/2023   K 4.0 04/26/2023   CL 105 04/26/2023   CO2 30 04/26/2023   GLUCOSE 105 (H) 04/26/2023   BUN 22 04/26/2023   CREATININE 1.04 04/26/2023   BILITOT 0.8 04/26/2023   AST 17 04/26/2023   ALT 20 04/26/2023   PROT 6.3 04/26/2023   CALCIUM 8.9 04/26/2023   GFRAA 95 10/18/2020   QFTBGOLDPLUS NEGATIVE 06/06/2017    Speciality Comments: No specialty comments available.  Procedures:  No procedures performed Allergies: Nicotine   Assessment / Plan:     Visit Diagnoses: Seronegative  Inflammatory arthritis-patient had no synovitis on the examination.  He had been doing well on the combination of methotrexate and sulfasalazine.  He still takes Celebrex as needed for joint pain.  High risk medication use - Sulfasalazine 500 mg 2 tablets by mouth twice daily, methotrexate 0.8 mL sq injections once weekly, and folic acid 2 mg daily.  Labs from April 26, 2023 CBC and CMP were normal.  He was advised to get repeat labs in mid March and then every 3 months to monitor for drug toxicity.  Information for immunization was placed in the AVS.  He was advised to hold methotrexate and sulfasalazine if he develops an infection resume after the infection resolves.  Primary osteoarthritis of both hands-PIP and DIP thickening with no synovitis was noted.  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  He had good range of motion of bilateral knee joints.  He is having difficulty getting up from the the squatting position and also from the floor.  He states he was doing better after physical therapy in the past.  Lower extremity muscle strengthening exercises and protein rich diet was discussed.  I will also refer him to physical therapy.  A handout on lower extremity  exercises was placed in the AVS.  Some of the exercises were demonstrated in the office.  Chronic pain of right ankle - right ankle reconstruction surgery in 1989 by Dr. Terrilee Croak.  He has limited range of motion in his right ankle.  Left ankle joint was in full range of motion.  History of kidney stones  Anxiety and depression  Poor balance-patient states his balance is not as good as it used to be previously.  I will refer him to physical therapy.  History of recent fall-he reports recent falls after tripping in the last few months.  I will refer him to physical therapy for lower extremity muscle strengthening and fall prevention.  Orders: No orders of the defined types were placed in this encounter.  No orders of the defined types were placed in this encounter.    Follow-Up Instructions: Return in about 5 months (around 12/10/2023) for Rheumatoid arthritis.   [name], MD  Note - This record has been created using Animal nutritionist.  Chart creation errors have been sought, but may not always  have been located. Such creation errors do not reflect on  the standard of medical care.

## 2023-07-10 ENCOUNTER — Encounter: Payer: Self-pay | Admitting: Rheumatology

## 2023-07-10 ENCOUNTER — Ambulatory Visit: Payer: Medicare Other | Attending: Rheumatology | Admitting: Rheumatology

## 2023-07-10 ENCOUNTER — Other Ambulatory Visit: Payer: Self-pay

## 2023-07-10 VITALS — BP 156/88 | HR 61 | Resp 14 | Ht 70.0 in | Wt 198.0 lb

## 2023-07-10 DIAGNOSIS — M1990 Unspecified osteoarthritis, unspecified site: Secondary | ICD-10-CM | POA: Diagnosis not present

## 2023-07-10 DIAGNOSIS — G8929 Other chronic pain: Secondary | ICD-10-CM

## 2023-07-10 DIAGNOSIS — G5602 Carpal tunnel syndrome, left upper limb: Secondary | ICD-10-CM

## 2023-07-10 DIAGNOSIS — M25571 Pain in right ankle and joints of right foot: Secondary | ICD-10-CM

## 2023-07-10 DIAGNOSIS — R2689 Other abnormalities of gait and mobility: Secondary | ICD-10-CM

## 2023-07-10 DIAGNOSIS — M19041 Primary osteoarthritis, right hand: Secondary | ICD-10-CM

## 2023-07-10 DIAGNOSIS — F32A Depression, unspecified: Secondary | ICD-10-CM

## 2023-07-10 DIAGNOSIS — Z9181 History of falling: Secondary | ICD-10-CM

## 2023-07-10 DIAGNOSIS — Z96659 Presence of unspecified artificial knee joint: Secondary | ICD-10-CM

## 2023-07-10 DIAGNOSIS — Z79899 Other long term (current) drug therapy: Secondary | ICD-10-CM | POA: Diagnosis not present

## 2023-07-10 DIAGNOSIS — M19042 Primary osteoarthritis, left hand: Secondary | ICD-10-CM

## 2023-07-10 DIAGNOSIS — F419 Anxiety disorder, unspecified: Secondary | ICD-10-CM

## 2023-07-10 DIAGNOSIS — Z87442 Personal history of urinary calculi: Secondary | ICD-10-CM

## 2023-07-10 NOTE — Progress Notes (Unsigned)
 Please review and sign pended physical therapy referral. Thanks!

## 2023-07-10 NOTE — Patient Instructions (Addendum)
 Standing Labs We placed an order today for your standing lab work.   Please have your standing labs drawn in  March and every 3 months  Please have your labs drawn 2 weeks prior to your appointment so that the provider can discuss your lab results at your appointment, if possible.  Please note that you may see your imaging and lab results in MyChart before we have reviewed them. We will contact you once all results are reviewed. Please allow our office up to 72 hours to thoroughly review all of the results before contacting the office for clarification of your results.  WALK-IN LAB HOURS  Monday through Thursday from 8:00 am -12:30 pm and 1:00 pm-5:00 pm and Friday from 8:00 am-12:00 pm.  Patients with office visits requiring labs will be seen before walk-in labs.  You may encounter longer than normal wait times. Please allow additional time. Wait times may be shorter on  Monday and Thursday afternoons.  We do not book appointments for walk-in labs. We appreciate your patience and understanding with our staff.   Labs are drawn by [facility]. Please bring your co-pay at the time of your lab draw.  You may receive a bill from [facility] for your lab work.  Please note if you are on Hydroxychloroquine and and an order has been placed for a Hydroxychloroquine level,  you will need to have it drawn 4 hours or more after your last dose.  If you wish to have your labs drawn at another location, please call the office 24 hours in advance so we can fax the orders.  The office is located at [address], [address], [place], [place] [address]   If you have any questions regarding directions or hours of operation,  please call [phone].   As a reminder, please drink plenty of water prior to coming for your lab work. Thanks!   Vaccines You are taking a medication(s) that can suppress your immune system.  The following immunizations are recommended: Flu annually Covid-19  RSV Td/Tdap (tetanus,  diphtheria, pertussis) every 10 years Pneumonia (Prevnar 15 then Pneumovax 23 at least 1 year apart.  Alternatively, can take Prevnar 20 without needing additional dose) Shingrix: 2 doses from 4 weeks to 6 months apart  Please check with your PCP to make sure you are up to date.  If you have signs or symptoms of an infection or start antibiotics: First, call your PCP for workup of your infection. Hold your medication through the infection, until you complete your antibiotics, and until symptoms resolve if you take the following: Injectable medication (Actemra, Benlysta, Cimzia, Cosentyx, Enbrel, Humira, Kevzara, Orencia, Remicade, Simponi, Stelara, Taltz, Tremfya) Methotrexate Leflunomide (Arava) Mycophenolate (Cellcept) Osborne [place], or Rinvoq Exercises for Chronic Knee Pain Chronic knee pain is pain that lasts longer than 3 months. For most people with chronic knee pain, exercise and weight loss is an important part of treatment. Your health care provider may want you to focus on: Making the muscles that support your knee stronger. This can take pressure off your knee and reduce pain. Preventing knee stiffness. How far you can move your knee, keeping it there or making it farther. Losing weight (if this applies) to take pressure off your knee, lower your risk for injury, and make it easier for you to exercise. Your provider will help you make an exercise program that fits your needs and physical abilities. Below are simple, low-impact exercises you can do at home. Ask your provider or physical  therapist how often you should do your exercise program and how many times to repeat each exercise. General safety tips  Get your provider's approval before doing any exercises. Start slowly and stop any time you feel pain. Do not exercise if your knee pain is flaring up. Warm up first. Stretching a cold muscle can cause an injury. Do 5-10 minutes of easy movement or light stretching before  beginning your exercises. Do 5-10 minutes of low-impact activity (like walking or cycling) before starting strengthening exercises. Contact your provider any time you have pain during or after exercising. Exercise can cause discomfort but should not be painful. It is normal to be a little stiff or sore after exercising. Stretching and range-of-motion exercises Front thigh stretch  Stand up straight and support your body by holding on to a chair or resting one hand on a wall. With your legs straight and close together, bend one knee to lift your heel up toward your butt. Using one hand for support, grab your ankle with your free hand. Pull your foot up closer toward your butt to feel the stretch in front of your thigh. Hold the stretch for 30 seconds. Repeat __________ times. Complete this exercise __________ times a day. Back thigh stretch  Sit on the floor with your back straight and your legs out straight in front of you. Place the palms of your hands on the floor and slide them toward your feet as you bend at the hip. Try to touch your nose to your knees and feel the stretch in the back of your thighs. Hold for 30 seconds. Repeat __________ times. Complete this exercise __________ times a day. Calf stretch  Stand facing a wall. Place the palms of your hands flat against the wall, arms extended, and lean slightly against the wall. Get into a lunge position with one leg bent at the knee and the other leg stretched out straight behind you. Keep both feet facing the wall and increase the bend in your knee while keeping the heel of the other leg flat on the ground. You should feel the stretch in your calf. Hold for 30 seconds. Repeat __________ times. Complete this exercise __________ times a day. Strengthening exercises Straight leg lift  Lie on your back with one knee bent and the other leg out straight. Slowly lift the straight leg without bending the knee. Lift until your foot is  about 12 inches (30 cm) off the floor. Hold for 3-5 seconds and slowly lower your leg. Repeat __________ times. Complete this exercise __________ times a day. Single leg dip  Stand between two chairs and put both hands on the backs of the chairs for support. Extend one leg out straight with your body weight resting on the heel of the standing leg. Slowly bend your standing knee to dip your body to the level that is comfortable for you. Hold for 3-5 seconds. Repeat __________ times. Complete this exercise __________ times a day. Hamstring curls  Stand straight, knees close together, facing the back of a chair. Hold on to the back of a chair with both hands. Keep one leg straight. Bend the other knee while bringing the heel up toward the butt until the knee is bent at a 90-degree angle (right angle). Hold for 3-5 seconds. Repeat __________ times. Complete this exercise __________ times a day. Wall squat  Stand straight with your back, hips, and head against a wall. Step forward one foot at a time with your back still against  the wall. Your feet should be 2 feet (61 cm) from the wall at shoulder width. Keeping your back, hips, and head against the wall, slide down the wall to as close to a sitting position as you can get. Hold for 5-10 seconds, then slowly slide back up. Repeat __________ times. Complete this exercise __________ times a day. Step-ups  Stand in front of a sturdy platform or stool that is about 6 inches (15 cm) high. Slowly step up with your left / right foot, keeping your knee in line with your hip and foot. Do not let your knee bend so far that you cannot see your toes. Hold on to a chair for balance, but do not use it for support. Slowly unlock your knee and lower yourself to the starting position. Repeat __________ times. Complete this exercise __________ times a day. Contact a health care provider if: Your exercises cause pain. Your pain is worse after you  exercise. Your pain prevents you from doing your exercises. This information is not intended to replace advice given to you by your health care provider. Make sure you discuss any questions you have with your health care provider. Document Revised: 05/09/2022 Document Reviewed: 05/09/2022 Elsevier Patient Education  2024 [employer].

## 2023-07-16 DIAGNOSIS — M62830 Muscle spasm of back: Secondary | ICD-10-CM | POA: Diagnosis not present

## 2023-07-16 DIAGNOSIS — M4802 Spinal stenosis, cervical region: Secondary | ICD-10-CM | POA: Diagnosis not present

## 2023-07-16 DIAGNOSIS — R221 Localized swelling, mass and lump, neck: Secondary | ICD-10-CM | POA: Diagnosis not present

## 2023-07-16 DIAGNOSIS — M47812 Spondylosis without myelopathy or radiculopathy, cervical region: Secondary | ICD-10-CM | POA: Diagnosis not present

## 2023-07-16 DIAGNOSIS — M62838 Other muscle spasm: Secondary | ICD-10-CM | POA: Diagnosis not present

## 2023-07-16 DIAGNOSIS — I2510 Atherosclerotic heart disease of native coronary artery without angina pectoris: Secondary | ICD-10-CM | POA: Diagnosis not present

## 2023-07-16 DIAGNOSIS — M542 Cervicalgia: Secondary | ICD-10-CM | POA: Diagnosis not present

## 2023-07-16 DIAGNOSIS — M4313 Spondylolisthesis, cervicothoracic region: Secondary | ICD-10-CM | POA: Diagnosis not present

## 2023-07-16 DIAGNOSIS — M436 Torticollis: Secondary | ICD-10-CM | POA: Diagnosis not present

## 2023-07-17 DIAGNOSIS — R221 Localized swelling, mass and lump, neck: Secondary | ICD-10-CM | POA: Diagnosis not present

## 2023-07-17 DIAGNOSIS — M542 Cervicalgia: Secondary | ICD-10-CM | POA: Diagnosis not present

## 2023-07-17 DIAGNOSIS — M4802 Spinal stenosis, cervical region: Secondary | ICD-10-CM | POA: Diagnosis not present

## 2023-07-17 DIAGNOSIS — I2510 Atherosclerotic heart disease of native coronary artery without angina pectoris: Secondary | ICD-10-CM | POA: Diagnosis not present

## 2023-07-17 DIAGNOSIS — M4313 Spondylolisthesis, cervicothoracic region: Secondary | ICD-10-CM | POA: Diagnosis not present

## 2023-07-17 DIAGNOSIS — M47812 Spondylosis without myelopathy or radiculopathy, cervical region: Secondary | ICD-10-CM | POA: Diagnosis not present

## 2023-07-26 DIAGNOSIS — M62552 Muscle wasting and atrophy, not elsewhere classified, left thigh: Secondary | ICD-10-CM | POA: Diagnosis not present

## 2023-07-26 DIAGNOSIS — M62551 Muscle wasting and atrophy, not elsewhere classified, right thigh: Secondary | ICD-10-CM | POA: Diagnosis not present

## 2023-07-26 DIAGNOSIS — R2689 Other abnormalities of gait and mobility: Secondary | ICD-10-CM | POA: Diagnosis not present

## 2023-08-06 DIAGNOSIS — R2689 Other abnormalities of gait and mobility: Secondary | ICD-10-CM | POA: Diagnosis not present

## 2023-08-06 DIAGNOSIS — M62551 Muscle wasting and atrophy, not elsewhere classified, right thigh: Secondary | ICD-10-CM | POA: Diagnosis not present

## 2023-08-06 DIAGNOSIS — M62552 Muscle wasting and atrophy, not elsewhere classified, left thigh: Secondary | ICD-10-CM | POA: Diagnosis not present

## 2023-08-08 DIAGNOSIS — R2689 Other abnormalities of gait and mobility: Secondary | ICD-10-CM | POA: Diagnosis not present

## 2023-08-08 DIAGNOSIS — M62551 Muscle wasting and atrophy, not elsewhere classified, right thigh: Secondary | ICD-10-CM | POA: Diagnosis not present

## 2023-08-08 DIAGNOSIS — M62552 Muscle wasting and atrophy, not elsewhere classified, left thigh: Secondary | ICD-10-CM | POA: Diagnosis not present

## 2023-08-14 DIAGNOSIS — M62552 Muscle wasting and atrophy, not elsewhere classified, left thigh: Secondary | ICD-10-CM | POA: Diagnosis not present

## 2023-08-14 DIAGNOSIS — M62551 Muscle wasting and atrophy, not elsewhere classified, right thigh: Secondary | ICD-10-CM | POA: Diagnosis not present

## 2023-08-14 DIAGNOSIS — R2689 Other abnormalities of gait and mobility: Secondary | ICD-10-CM | POA: Diagnosis not present

## 2023-08-17 DIAGNOSIS — M62552 Muscle wasting and atrophy, not elsewhere classified, left thigh: Secondary | ICD-10-CM | POA: Diagnosis not present

## 2023-08-17 DIAGNOSIS — M62551 Muscle wasting and atrophy, not elsewhere classified, right thigh: Secondary | ICD-10-CM | POA: Diagnosis not present

## 2023-08-17 DIAGNOSIS — R2689 Other abnormalities of gait and mobility: Secondary | ICD-10-CM | POA: Diagnosis not present

## 2023-08-20 DIAGNOSIS — E782 Mixed hyperlipidemia: Secondary | ICD-10-CM | POA: Diagnosis not present

## 2023-08-20 DIAGNOSIS — M62551 Muscle wasting and atrophy, not elsewhere classified, right thigh: Secondary | ICD-10-CM | POA: Diagnosis not present

## 2023-08-20 DIAGNOSIS — F339 Major depressive disorder, recurrent, unspecified: Secondary | ICD-10-CM | POA: Diagnosis not present

## 2023-08-20 DIAGNOSIS — K219 Gastro-esophageal reflux disease without esophagitis: Secondary | ICD-10-CM | POA: Diagnosis not present

## 2023-08-20 DIAGNOSIS — M0600 Rheumatoid arthritis without rheumatoid factor, unspecified site: Secondary | ICD-10-CM | POA: Diagnosis not present

## 2023-08-20 DIAGNOSIS — M150 Primary generalized (osteo)arthritis: Secondary | ICD-10-CM | POA: Diagnosis not present

## 2023-08-20 DIAGNOSIS — R5383 Other fatigue: Secondary | ICD-10-CM | POA: Diagnosis not present

## 2023-08-20 DIAGNOSIS — Z79899 Other long term (current) drug therapy: Secondary | ICD-10-CM | POA: Diagnosis not present

## 2023-08-20 DIAGNOSIS — R5381 Other malaise: Secondary | ICD-10-CM | POA: Diagnosis not present

## 2023-08-20 DIAGNOSIS — R2689 Other abnormalities of gait and mobility: Secondary | ICD-10-CM | POA: Diagnosis not present

## 2023-08-20 DIAGNOSIS — M62552 Muscle wasting and atrophy, not elsewhere classified, left thigh: Secondary | ICD-10-CM | POA: Diagnosis not present

## 2023-08-23 DIAGNOSIS — M62551 Muscle wasting and atrophy, not elsewhere classified, right thigh: Secondary | ICD-10-CM | POA: Diagnosis not present

## 2023-08-23 DIAGNOSIS — R2689 Other abnormalities of gait and mobility: Secondary | ICD-10-CM | POA: Diagnosis not present

## 2023-08-23 DIAGNOSIS — M62552 Muscle wasting and atrophy, not elsewhere classified, left thigh: Secondary | ICD-10-CM | POA: Diagnosis not present

## 2023-08-28 DIAGNOSIS — M62552 Muscle wasting and atrophy, not elsewhere classified, left thigh: Secondary | ICD-10-CM | POA: Diagnosis not present

## 2023-08-28 DIAGNOSIS — R2689 Other abnormalities of gait and mobility: Secondary | ICD-10-CM | POA: Diagnosis not present

## 2023-08-28 DIAGNOSIS — M62551 Muscle wasting and atrophy, not elsewhere classified, right thigh: Secondary | ICD-10-CM | POA: Diagnosis not present

## 2023-09-05 DIAGNOSIS — R2689 Other abnormalities of gait and mobility: Secondary | ICD-10-CM | POA: Diagnosis not present

## 2023-09-05 DIAGNOSIS — M62551 Muscle wasting and atrophy, not elsewhere classified, right thigh: Secondary | ICD-10-CM | POA: Diagnosis not present

## 2023-09-05 DIAGNOSIS — M62552 Muscle wasting and atrophy, not elsewhere classified, left thigh: Secondary | ICD-10-CM | POA: Diagnosis not present

## 2023-09-12 DIAGNOSIS — M62552 Muscle wasting and atrophy, not elsewhere classified, left thigh: Secondary | ICD-10-CM | POA: Diagnosis not present

## 2023-09-12 DIAGNOSIS — R2689 Other abnormalities of gait and mobility: Secondary | ICD-10-CM | POA: Diagnosis not present

## 2023-09-12 DIAGNOSIS — M62551 Muscle wasting and atrophy, not elsewhere classified, right thigh: Secondary | ICD-10-CM | POA: Diagnosis not present

## 2023-10-04 DIAGNOSIS — K080 Exfoliation of teeth due to systemic causes: Secondary | ICD-10-CM | POA: Diagnosis not present

## 2023-12-10 NOTE — Progress Notes (Unsigned)
 Office Visit Note  Patient: [name]             Date of Birth: [birth year]           MRN: [id number]             PCP: Thurmond Cathlyn LABOR., MD Referring: Thurmond Cathlyn LABOR., MD Visit Date: 12/24/2023 Occupation: @GUAROCC @  Subjective:  Medication monitoring   History of Present Illness: PAT SIRES is a 68 y.o. male with history of seronegative rheumatoid arthritis and osteoarthritis. Patient is prescribed Sulfasalazine  500 mg 2 tablets by mouth twice daily, methotrexate  0.8 mL sq injections once weekly, and folic acid  2 mg daily.  He has been out of his prescription for methotrexate  and sulfasalazine  for several weeks.  Patient states he has noticed some increased pain and stiffness involving both hands.  He takes Celebrex 200 mg 1 capsule daily for symptomatic relief.  He denies any morning stiffness, nocturnal pain, or difficulty performing ADLs.    Activities of Daily Living:  Patient reports morning stiffness for 0 minute.   Patient Denies nocturnal pain.  Difficulty dressing/grooming: Denies Difficulty climbing stairs: Denies Difficulty getting out of chair: Denies Difficulty using hands for taps, buttons, cutlery, and/or writing: Denies  Review of Systems  Constitutional:  Negative for fatigue.  HENT:  Negative for mouth sores and mouth dryness.   Eyes:  Negative for dryness.  Respiratory:  Negative for shortness of breath.   Cardiovascular:  Negative for chest pain and palpitations.  Gastrointestinal:  Negative for blood in stool, constipation and diarrhea.  Endocrine: Negative for increased urination.  Genitourinary:  Negative for involuntary urination.  Musculoskeletal:  Positive for joint pain and joint pain. Negative for gait problem, joint swelling, myalgias, muscle weakness, morning stiffness, muscle tenderness and myalgias.  Skin:  Negative for color change, rash, hair loss and sensitivity to sunlight.  Allergic/Immunologic: Negative for susceptible to  infections.  Neurological:  Negative for dizziness and headaches.  Hematological:  Negative for swollen glands.  Psychiatric/Behavioral:  Negative for depressed mood and sleep disturbance. The patient is not nervous/anxious.     PMFS History:  Patient Active Problem List   Diagnosis Date Noted   Genetic testing 05/19/2022   Mixed hyperlipidemia 06/25/2020   GERD without esophagitis 07/23/2018   Primary osteoarthritis of both hands 02/05/2018   History of prosthetic unicompartmental arthroplasty of left knee 02/05/2018   Anxiety and depression 02/05/2018   History of kidney stones 02/05/2018   High risk medication use 02/05/2018   Seronegative Inflammatory arthritis 01/15/2017   Primary osteoarthritis involving multiple joints 08/22/2016   Carpal tunnel syndrome of left wrist 04/13/2016   Chronic recurrent major depressive disorder (HCC) 11/02/2015   Malaise and fatigue 11/02/2015   Seronegative rheumatoid arthritis (HCC) 11/02/2015    Past Medical History:  Diagnosis Date   Arthritis    Hyperthyroidism    Kidney stone     Family History  Problem Relation Age of Onset   Breast cancer Mother 41   Lung cancer Maternal Uncle        mother's paternal half brother   Past Surgical History:  Procedure Laterality Date   ANKLE ARTHROPLASTY Right    1989 right ankle reconstruction   CARPAL TUNNEL RELEASE Bilateral    JOINT REPLACEMENT     right knee partial    JOINT REPLACEMENT     left knee partial   KNEE ARTHROPLASTY     KNEE ARTHROSCOPY     TOTAL SHOULDER ARTHROPLASTY  Social History   Social History Narrative   Not on file   Immunization History  Administered Date(s) Administered   Comptroller (J&J) SARS-COV-2 Vaccination 10/15/2019   Moderna Covid-19 Vaccine Bivalent Booster 62yrs & up 03/25/2021   Moderna Sars-Covid-2 Vaccination 05/12/2020   Pneumococcal Conjugate-13 08/28/2016   Pneumococcal Polysaccharide-23 02/26/2017   Td 05/08/2005   Zoster  Recombinant(Shingrix) 08/02/2017, 11/30/2017     Objective: Vital Signs: BP 119/70 (BP Location: Left Arm, Patient Position: Sitting, Cuff Size: Normal)   Pulse 85   Resp 14   Ht 5' 10 (1.778 m)   Wt 204 lb (92.5 kg)   BMI 29.27 kg/m    Physical Exam Vitals and nursing note reviewed.  Constitutional:      Appearance: He is well-developed.  HENT:     Head: Normocephalic and atraumatic.  Eyes:     Conjunctiva/sclera: Conjunctivae normal.     Pupils: Pupils are equal, round, and reactive to light.  Cardiovascular:     Rate and Rhythm: Normal rate and regular rhythm.     Heart sounds: Normal heart sounds.  Pulmonary:     Effort: Pulmonary effort is normal.     Breath sounds: Normal breath sounds.  Abdominal:     General: Bowel sounds are normal.     Palpations: Abdomen is soft.  Musculoskeletal:     Cervical back: Normal range of motion and neck supple.  Skin:    General: Skin is warm and dry.     Capillary Refill: Capillary refill takes less than 2 seconds.  Neurological:     Mental Status: He is alert and oriented to person, place, and time.  Psychiatric:        Behavior: Behavior normal.      Musculoskeletal Exam: C-spine has good range of motion.  Shoulder joints, elbow joints, wrist joints, MCPs, PIPs, DIPs have good range of motion with no synovitis.  Complete fist formation bilaterally.  Hip joints have good range of motion with no groin pain.  Partial knee replacements have good range of motion with no warmth or effusion.  Right ankle has slightly limited range of motion.  Left ankle has good range of motion with no tenderness or synovitis. CDAI Exam: CDAI Score: -- Patient Global: --; Provider Global: -- Swollen: --; Tender: -- Joint Exam 12/24/2023   No joint exam has been documented for this visit   There is currently no information documented on the homunculus. Go to the Rheumatology activity and complete the homunculus joint exam.  Investigation: No  additional findings.  Imaging: No results found.  Recent Labs: Lab Results  Component Value Date   WBC 6.0 04/26/2023   HGB 14.4 04/26/2023   PLT 221 04/26/2023   NA 142 04/26/2023   K 4.0 04/26/2023   CL 105 04/26/2023   CO2 30 04/26/2023   GLUCOSE 105 (H) 04/26/2023   BUN 22 04/26/2023   CREATININE 1.04 04/26/2023   BILITOT 0.8 04/26/2023   AST 17 04/26/2023   ALT 20 04/26/2023   PROT 6.3 04/26/2023   CALCIUM 8.9 04/26/2023   GFRAA 95 10/18/2020   QFTBGOLDPLUS NEGATIVE 06/06/2017    Speciality Comments: No specialty comments available.  Procedures:  No procedures performed Allergies: Nicotine   Assessment / Plan:     Visit Diagnoses: Seronegative Inflammatory arthritis: He has no synovitis on examination today.  He presents today with some increased soreness and stiffness in both hands which he attributes to being out of his prescriptions for methotrexate  and sulfasalazine   for the past several weeks.  He has been taking Celebrex 200 mg 1 capsule daily for symptomatic relief. Patient had updated CBC and CMP today to monitor for drug toxicity.  No medication changes will be made at this time.  Refills of both sulfasalazine  and methotrexate  will be sent to the pharmacy.  He was advised to notify us  if he develops any signs or symptoms of a flare.  He will follow-up in the office in 5 months or sooner if needed.  High risk medication use - Sulfasalazine  500 mg 2 tablets by mouth twice daily, methotrexate  0.8 mL sq injections once weekly, and folic acid  2 mg daily. CBC and CMP updated on today on 12/24/23--pending.  No recent or recurrent infections. Discussed the importance of holding methotrexate  and sulfasalazine  if he develops signs or sympotms of an infection and to resume once the infection has completely cleared.   Primary osteoarthritis of both hands: PIP and DIP thickening consistent with osteoarthritis of both hands.  No synovitis noted.  He has noticed an increase  soreness and stiffness in both hands which he attributes to being out of his prescriptions for methotrexate  and sulfasalazine  for the past several weeks.  He has been taking Celebrex 200 mg 1 capsule daily for symptomatic relief.  Chronic pain of right ankle: Right ankle reconstruction surgery in 1989 by Dr. Marisue. No inflammation noted.    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  He had good range of motion of bilateral knee joints.no effusion noted.  Other medical conditions are listed as follows:  History of kidney stones  Anxiety and depression  Orders: No orders of the defined types were placed in this encounter.  No orders of the defined types were placed in this encounter.    Follow-Up Instructions: Return in about 5 months (around 05/25/2024) for Rheumatoid arthritis, Osteoarthritis.   Waddell CHRISTELLA Craze, PA-C  Note - This record has been created using Dragon software.  Chart creation errors have been sought, but may not always  have been located. Such creation errors do not reflect on  the standard of medical care.

## 2023-12-24 ENCOUNTER — Ambulatory Visit: Attending: Physician Assistant | Admitting: Physician Assistant

## 2023-12-24 ENCOUNTER — Other Ambulatory Visit: Payer: Self-pay

## 2023-12-24 ENCOUNTER — Other Ambulatory Visit: Payer: Self-pay | Admitting: *Deleted

## 2023-12-24 ENCOUNTER — Encounter: Payer: Self-pay | Admitting: Physician Assistant

## 2023-12-24 VITALS — BP 119/70 | HR 85 | Resp 14 | Ht 70.0 in | Wt 204.0 lb

## 2023-12-24 DIAGNOSIS — M19041 Primary osteoarthritis, right hand: Secondary | ICD-10-CM

## 2023-12-24 DIAGNOSIS — F32A Depression, unspecified: Secondary | ICD-10-CM

## 2023-12-24 DIAGNOSIS — Z79899 Other long term (current) drug therapy: Secondary | ICD-10-CM | POA: Diagnosis not present

## 2023-12-24 DIAGNOSIS — M25571 Pain in right ankle and joints of right foot: Secondary | ICD-10-CM

## 2023-12-24 DIAGNOSIS — M1990 Unspecified osteoarthritis, unspecified site: Secondary | ICD-10-CM | POA: Diagnosis not present

## 2023-12-24 DIAGNOSIS — Z9181 History of falling: Secondary | ICD-10-CM

## 2023-12-24 DIAGNOSIS — G8929 Other chronic pain: Secondary | ICD-10-CM

## 2023-12-24 DIAGNOSIS — F419 Anxiety disorder, unspecified: Secondary | ICD-10-CM

## 2023-12-24 DIAGNOSIS — Z96659 Presence of unspecified artificial knee joint: Secondary | ICD-10-CM

## 2023-12-24 DIAGNOSIS — Z87442 Personal history of urinary calculi: Secondary | ICD-10-CM

## 2023-12-24 DIAGNOSIS — M19042 Primary osteoarthritis, left hand: Secondary | ICD-10-CM

## 2023-12-24 MED ORDER — METHOTREXATE SODIUM CHEMO INJECTION 50 MG/2ML: 20.0000 mg | INTRAMUSCULAR | 0 refills | Status: DC

## 2023-12-24 MED ORDER — SULFASALAZINE 500 MG PO TBEC: 1000.0000 mg | DELAYED_RELEASE_TABLET | Freq: Two times a day (BID) | ORAL | 0 refills | Status: DC

## 2023-12-24 NOTE — Progress Notes (Unsigned)
 Patient seen in office today, please review and sign.

## 2023-12-24 NOTE — Patient Instructions (Signed)
 Standing Labs We placed an order today for your standing lab work.   Please have your standing labs drawn in November and every 3 months   Please have your labs drawn 2 weeks prior to your appointment so that the provider can discuss your lab results at your appointment, if possible.  Please note that you may see your imaging and lab results in MyChart before we have reviewed them. We will contact you once all results are reviewed. Please allow our office up to 72 hours to thoroughly review all of the results before contacting the office for clarification of your results.  WALK-IN LAB HOURS  Monday through Thursday from 8:00 am -12:30 pm and 1:00 pm-4:30 pm and Friday from 8:00 am-12:00 pm.  Patients with office visits requiring labs will be seen before walk-in labs.  You may encounter longer than normal wait times. Please allow additional time. Wait times may be shorter on  Monday and Thursday afternoons.  We do not book appointments for walk-in labs. We appreciate your patience and understanding with our staff.   Labs are drawn by [facility]. Please bring your co-pay at the time of your lab draw.  You may receive a bill from [facility] for your lab work.  Please note if you are on Hydroxychloroquine  and and an order has been placed for a Hydroxychloroquine  level,  you will need to have it drawn 4 hours or more after your last dose.  If you wish to have your labs drawn at another location, please call the office 24 hours in advance so we can fax the orders.  The office is located at [address], [place], [place] [address]   If you have any questions regarding directions or hours of operation,  please call [phone].   As a reminder, please drink plenty of water prior to coming for your lab work. Thanks!

## 2023-12-25 ENCOUNTER — Ambulatory Visit: Payer: Self-pay | Admitting: Rheumatology

## 2023-12-25 LAB — CBC WITH DIFFERENTIAL/PLATELET
Absolute Lymphocytes: 1586 {cells}/uL (ref 850–3900)
Absolute Monocytes: 598 {cells}/uL (ref 200–950)
Basophils Absolute: 52 {cells}/uL (ref 0–200)
Basophils Relative: 1 %
Eosinophils Absolute: 109 {cells}/uL (ref 15–500)
Eosinophils Relative: 2.1 %
HCT: 43.6 % (ref 38.5–50.0)
Hemoglobin: 14.3 g/dL (ref 13.2–17.1)
MCH: 32.2 pg (ref 27.0–33.0)
MCHC: 32.8 g/dL (ref 32.0–36.0)
MCV: 98.2 fL (ref 80.0–100.0)
MPV: 10.4 fL (ref 7.5–12.5)
Monocytes Relative: 11.5 %
Neutro Abs: 2855 {cells}/uL (ref 1500–7800)
Neutrophils Relative %: 54.9 %
Platelets: 217 Thousand/uL (ref 140–400)
RBC: 4.44 Million/uL (ref 4.20–5.80)
RDW: 13.7 % (ref 11.0–15.0)
Total Lymphocyte: 30.5 %
WBC: 5.2 Thousand/uL (ref 3.8–10.8)

## 2023-12-25 LAB — COMPREHENSIVE METABOLIC PANEL WITH GFR
AG Ratio: 2.1 (calc) (ref 1.0–2.5)
ALT: 21 U/L (ref 9–46)
AST: 19 U/L (ref 10–35)
Albumin: 4.1 g/dL (ref 3.6–5.1)
Alkaline phosphatase (APISO): 59 U/L (ref 35–144)
BUN: 22 mg/dL (ref 7–25)
CO2: 29 mmol/L (ref 20–32)
Calcium: 8.9 mg/dL (ref 8.6–10.3)
Chloride: 105 mmol/L (ref 98–110)
Creat: 1.09 mg/dL (ref 0.70–1.35)
Globulin: 2 g/dL (ref 1.9–3.7)
Glucose, Bld: 90 mg/dL (ref 65–99)
Potassium: 4.1 mmol/L (ref 3.5–5.3)
Sodium: 142 mmol/L (ref 135–146)
Total Bilirubin: 1.3 mg/dL — ABNORMAL HIGH (ref 0.2–1.2)
Total Protein: 6.1 g/dL (ref 6.1–8.1)
eGFR: 73 mL/min/1.73m2 (ref >=60)

## 2023-12-25 NOTE — Progress Notes (Signed)
 CBC and CMP are stable.

## 2024-04-28 ENCOUNTER — Other Ambulatory Visit: Payer: Self-pay | Admitting: *Deleted

## 2024-04-28 MED ORDER — SULFASALAZINE 500 MG PO TBEC: 1000.0000 mg | DELAYED_RELEASE_TABLET | Freq: Two times a day (BID) | ORAL | 0 refills | Status: AC

## 2024-04-28 NOTE — Telephone Encounter (Signed)
 Patient contacted the office to request refill on SSZ  Last Fill: 12/24/2023  Labs: 03/31/2024 Bilirubin, Total 1.1  Next Visit: 05/29/2024  Last Visit: 12/24/2023  DX: Seronegative Inflammatory arthritis   Current Dose per office note 12/24/2023: Sulfasalazine  500 mg 2 tablets by mouth twice daily   Okay to refill Sulfasalazine ?

## 2024-05-05 ENCOUNTER — Telehealth: Payer: Self-pay

## 2024-05-05 NOTE — Telephone Encounter (Signed)
 Patient called the office to check on the refill of his sulfasalazine , advised that refill was sent on 04/28/2024 and recommended he contact the pharmacy. Verbalized understanding

## 2024-05-06 ENCOUNTER — Other Ambulatory Visit: Payer: Self-pay | Admitting: *Deleted

## 2024-05-06 MED ORDER — METHOTREXATE SODIUM CHEMO INJECTION 50 MG/2ML: 20.0000 mg | INTRAMUSCULAR | 0 refills | Status: AC

## 2024-05-06 NOTE — Telephone Encounter (Signed)
 Refill request received via fax from [facility]  for Methotrexate   Last Fill: 12/24/2023  Labs: 03/31/2024 Bilirubin, Total 1.1   Next Visit: 05/29/2024  Last Visit: 12/24/2023  DX:  Seronegative Inflammatory arthritis   Current Dose per office note 12/24/2023: methotrexate  0.8 mL sq injections once weekly   Okay to refill Methotrexate ?

## 2024-05-15 NOTE — Progress Notes (Signed)
 "  Office Visit Note  Patient: [name]             Date of Birth: [birth year]           MRN: [id number]             PCP: Thurmond Cathlyn LABOR., MD Referring: Thurmond Cathlyn LABOR., MD Visit Date: 05/29/2024 Occupation: Data Unavailable  Subjective:  Medication monitoring  History of Present Illness: [name] is a 69 y.o. male with seronegative rheumatoid arthritis and osteoarthritis.  He returns today after the last visit in August 2025.  He denies any episodes of joint pain or joint swelling.  He has been noticing some discomfort in the inguinal region which she describes as a muscle tightness.  He is on methotrexate  0.8 mL subcu weekly, folic acid  2 mg daily and sulfasalazine  500 mg 2 tablets p.o. twice daily.  He takes Celebrex 200 mg p.o. daily as needed.  He has had no interruption in treatment.  He has been doing some lower extremity muscle strengthening exercises.  He has noticed improved balance.  He denies any history of falls since the last visit.    Activities of Daily Living:  Patient reports morning stiffness for a few minutes.   Patient Denies nocturnal pain.  Difficulty dressing/grooming: Denies Difficulty climbing stairs: Denies Difficulty getting out of chair: Denies Difficulty using hands for taps, buttons, cutlery, and/or writing: Denies  Review of Systems  Constitutional:  Negative for fatigue.  HENT:  Negative for mouth sores and mouth dryness.   Eyes:  Negative for dryness.  Respiratory:  Negative for shortness of breath.   Cardiovascular:  Negative for chest pain and palpitations.  Gastrointestinal:  Negative for blood in stool, constipation and diarrhea.  Endocrine: Negative for increased urination.  Genitourinary:  Negative for involuntary urination.  Musculoskeletal:  Positive for myalgias, morning stiffness, muscle tenderness and myalgias. Negative for joint pain, gait problem, joint pain, joint swelling and muscle weakness.  Skin:  Negative for color  change, rash, hair loss and sensitivity to sunlight.  Allergic/Immunologic: Negative for susceptible to infections.  Neurological:  Negative for dizziness, numbness and headaches.  Hematological:  Negative for swollen glands.  Psychiatric/Behavioral:  Positive for sleep disturbance. Negative for depressed mood. The patient is not nervous/anxious.     PMFS History:  Patient Active Problem List   Diagnosis Date Noted   Genetic testing 05/19/2022   Mixed hyperlipidemia 06/25/2020   GERD without esophagitis 07/23/2018   Primary osteoarthritis of both hands 02/05/2018   History of prosthetic unicompartmental arthroplasty of left knee 02/05/2018   Anxiety and depression 02/05/2018   History of kidney stones 02/05/2018   High risk medication use 02/05/2018   Seronegative Inflammatory arthritis 01/15/2017   Primary osteoarthritis involving multiple joints 08/22/2016   Carpal tunnel syndrome of left wrist 04/13/2016   Chronic recurrent major depressive disorder 11/02/2015   Malaise and fatigue 11/02/2015   Seronegative rheumatoid arthritis (HCC) 11/02/2015    Past Medical History:  Diagnosis Date   Arthritis    Hyperthyroidism    Kidney stone     Family History  Problem Relation Age of Onset   Breast cancer Mother 57   Lung cancer Maternal Uncle        mother's paternal half brother   Past Surgical History:  Procedure Laterality Date   ANKLE ARTHROPLASTY Right    1989 right ankle reconstruction   CARPAL TUNNEL RELEASE Bilateral    JOINT REPLACEMENT  right knee partial    JOINT REPLACEMENT     left knee partial   KNEE ARTHROPLASTY     KNEE ARTHROSCOPY     TOTAL SHOULDER ARTHROPLASTY     Social History[1] Social History   Social History Narrative   Not on file     Immunization History  Administered Date(s) Administered   Comptroller (J&J) SARS-COV-2 Vaccination 10/15/2019   Moderna Covid-19 Vaccine Bivalent Booster 32yrs & up 03/25/2021   Moderna Sars-Covid-2  Vaccination 05/12/2020   Pneumococcal Conjugate-13 08/28/2016   Pneumococcal Polysaccharide-23 02/26/2017   Td 05/08/2005   Zoster Recombinant(Shingrix) 08/02/2017, 11/30/2017     Objective: Vital Signs: BP (!) 151/74   Pulse 69   Temp 98.2 F (36.8 C)   Resp 16   Ht 5' 10 (1.778 m)   Wt 205 lb (93 kg)   BMI 29.41 kg/m    Physical Exam Vitals and nursing note reviewed.  Constitutional:      Appearance: He is well-developed.  HENT:     Head: Normocephalic and atraumatic.  Eyes:     Conjunctiva/sclera: Conjunctivae normal.     Pupils: Pupils are equal, round, and reactive to light.  Cardiovascular:     Rate and Rhythm: Normal rate and regular rhythm.     Heart sounds: Normal heart sounds.  Pulmonary:     Effort: Pulmonary effort is normal.     Breath sounds: Normal breath sounds.  Abdominal:     General: Bowel sounds are normal.     Palpations: Abdomen is soft.  Musculoskeletal:     Cervical back: Normal range of motion and neck supple.  Skin:    General: Skin is warm and dry.     Capillary Refill: Capillary refill takes less than 2 seconds.  Neurological:     Mental Status: He is alert and oriented to person, place, and time.  Psychiatric:        Behavior: Behavior normal.      Musculoskeletal Exam: Cervical, thoracic and lumbar spine were in good range of motion.  There was no SI joint tenderness.  Shoulder joints, elbow joints, wrist joints, MCPs, PIPs and DIPs were in good range of motion with no synovitis.  Bilateral CMC, PIP and DIP thickening was noted.  Hip joints and knee joints were in good range of motion without any warmth swelling or effusion.  He reported some discomfort in the hip adductors with range of motion.  There was no tenderness over ankles or MTPs.   CDAI Exam: CDAI Score: -- Patient Global: 0 / 100; Provider Global: 0 / 100 Swollen: --; Tender: -- Joint Exam 05/29/2024   No joint exam has been documented for this visit   There is  currently no information documented on the homunculus. Go to the Rheumatology activity and complete the homunculus joint exam.  Investigation: No additional findings.  Imaging: No results found.  Recent Labs: Lab Results  Component Value Date   WBC 5.2 12/24/2023   HGB 14.3 12/24/2023   PLT 217 12/24/2023   NA 142 12/24/2023   K 4.1 12/24/2023   CL 105 12/24/2023   CO2 29 12/24/2023   GLUCOSE 90 12/24/2023   BUN 22 12/24/2023   CREATININE 1.09 12/24/2023   BILITOT 1.3 (H) 12/24/2023   AST 19 12/24/2023   ALT 21 12/24/2023   PROT 6.1 12/24/2023   CALCIUM 8.9 12/24/2023   GFRAA 95 10/18/2020   QFTBGOLDPLUS NEGATIVE 06/06/2017    Speciality Comments: No specialty comments available.  Procedures:  No procedures performed Allergies: Nicotine   Assessment / Plan:     Visit Diagnoses: Seronegative Inflammatory arthritis-patient had no synovitis on the examination.  He denies having a flare of rheumatoid arthritis.  He has been taking sulfasalazine  and methotrexate  on a regular basis.  He also has been taking Celebrex on a regular basis due to joint stiffness.  High risk medication use - Sulfasalazine  500 mg 2 tablets by mouth twice daily, methotrexate  0.8 mL sq injections once weekly, and folic acid  2 mg daily. -He has not had labs since August 2025.  Having labs on a regular basis every 3 months was emphasized.  Increased risk of hepatic and renal toxicity was discussed.  Will check labs today and then every 3 months.  Information regarding the immunization was placed in the AVS.  He was advised to hold methotrexate  if he develops an infection resume after the infection resolves.  Plan: CBC with Differential/Platelet, Comprehensive metabolic panel with GFR  Primary osteoarthritis of both hands-bilateral PIP and DIP thickening with no synovitis was noted.  Joint protection was discussed.  Chronic pain of right ankle - Right ankle reconstruction surgery in 1989 by Dr.  Marisue.  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  He had good range of motion.  He complains of some tightness in his hip adductor muscles.  Some of the exercises were demonstrated and handout was given.  History of kidney stones  Anxiety and depression  Poor balance-patient states that his balance has improved since he has been doing some strengthening exercises at home.  Orders: Orders Placed This Encounter  Procedures   CBC with Differential/Platelet   Comprehensive metabolic panel with GFR   No orders of the defined types were placed in this encounter.    Follow-Up Instructions: Return in about 5 months (around 10/27/2024) for Rheumatoid arthritis.   Maya Nash, MD  Note - This record has been created using Animal nutritionist.  Chart creation errors have been sought, but may not always  have been located. Such creation errors do not reflect on  the standard of medical care.     [1]  Social History Tobacco Use   Smoking status: Never    Passive exposure: Past   Smokeless tobacco: Never  Vaping Use   Vaping status: Never Used  Substance Use Topics   Alcohol use: Yes    Comment: occ   Drug use: No   "

## 2024-05-29 ENCOUNTER — Encounter: Payer: Self-pay | Admitting: Rheumatology

## 2024-05-29 ENCOUNTER — Ambulatory Visit: Attending: Rheumatology | Admitting: Rheumatology

## 2024-05-29 VITALS — BP 141/75 | HR 69 | Temp 98.2°F | Resp 16 | Ht 70.0 in | Wt 205.0 lb

## 2024-05-29 DIAGNOSIS — Z96659 Presence of unspecified artificial knee joint: Secondary | ICD-10-CM

## 2024-05-29 DIAGNOSIS — M19041 Primary osteoarthritis, right hand: Secondary | ICD-10-CM

## 2024-05-29 DIAGNOSIS — M25571 Pain in right ankle and joints of right foot: Secondary | ICD-10-CM | POA: Diagnosis not present

## 2024-05-29 DIAGNOSIS — Z87442 Personal history of urinary calculi: Secondary | ICD-10-CM

## 2024-05-29 DIAGNOSIS — G8929 Other chronic pain: Secondary | ICD-10-CM

## 2024-05-29 DIAGNOSIS — Z79899 Other long term (current) drug therapy: Secondary | ICD-10-CM

## 2024-05-29 DIAGNOSIS — M1380 Other specified arthritis, unspecified site: Secondary | ICD-10-CM

## 2024-05-29 DIAGNOSIS — Z9181 History of falling: Secondary | ICD-10-CM

## 2024-05-29 DIAGNOSIS — F32A Depression, unspecified: Secondary | ICD-10-CM

## 2024-05-29 DIAGNOSIS — F419 Anxiety disorder, unspecified: Secondary | ICD-10-CM

## 2024-05-29 DIAGNOSIS — R2689 Other abnormalities of gait and mobility: Secondary | ICD-10-CM | POA: Diagnosis not present

## 2024-05-29 DIAGNOSIS — G5602 Carpal tunnel syndrome, left upper limb: Secondary | ICD-10-CM

## 2024-05-29 NOTE — Patient Instructions (Addendum)
 Standing Labs We placed an order today for your standing lab work.   Please have your standing labs drawn in April and every 3 months  Please have your labs drawn 2 weeks prior to your appointment so that the provider can discuss your lab results at your appointment, if possible.  Please note that you may see your imaging and lab results in MyChart before we have reviewed them. We will contact you once all results are reviewed. Please allow our office up to 72 hours to thoroughly review all of the results before contacting the office for clarification of your results.  WALK-IN LAB HOURS  Monday through Thursday from 8:00 am - 4:30 pm and Friday from 8:00 am-12:00 pm.  Patients with office visits requiring labs will be seen before walk-in labs.  You may encounter longer than normal wait times. Please allow additional time. Wait times may be shorter on  Monday and Thursday afternoons.  We do not book appointments for walk-in labs. We appreciate your patience and understanding with our staff.   Labs are drawn by [facility]. Please bring your co-pay at the time of your lab draw.  You may receive a bill from [facility] for your lab work.  Please note if you are on Hydroxychloroquine and and an order has been placed for a Hydroxychloroquine level,  you will need to have it drawn 4 hours or more after your last dose.  If you wish to have your labs drawn at another location, please call the office 24 hours in advance so we can fax the orders.  The office is located at [address], [address], [place], [place] [address]   If you have any questions regarding directions or hours of operation,  please call [phone].   As a reminder, please drink plenty of water prior to coming for your lab work. Thanks!   Vaccines You are taking a medication(s) that can suppress your immune system.  The following immunizations are recommended: Flu annually Covid-19  Td/Tdap (tetanus, diphtheria, pertussis) every  10 years Pneumonia (Prevnar 15 then Pneumovax 23 at least 1 year apart.  Alternatively, can take Prevnar 20 without needing additional dose) Shingrix: 2 doses from 4 weeks to 6 months apart  Please check with your PCP to make sure you are up to date.   If you have signs or symptoms of an infection or start antibiotics: First, call your PCP for workup of your infection. Hold your medication through the infection, until you complete your antibiotics, and until symptoms resolve if you take the following: Injectable medication (Actemra, Benlysta, Cimzia, Cosentyx, Enbrel, Humira, Kevzara, Orencia, Remicade, Simponi, Stelara, Taltz, Tremfya) Methotrexate  Leflunomide (Arava) Mycophenolate (Cellcept) Xeljanz, Olumiant, or Rinvoq   Hip Exercises Ask your health care provider which exercises are safe for you. Do exercises exactly as told by your provider and adjust them as told. It is normal to feel mild stretching, pulling, tightness, or discomfort as you do these exercises. Stop right away if you feel sudden pain or your pain gets worse. Do not begin these exercises until told by your provider. Stretching and range-of-motion exercises These exercises warm up your muscles and joints and improve the movement and flexibility of your hip. They also help to relieve pain, numbness, and tingling. You may be asked to limit your range of motion if you had a hip replacement. Talk to your provider about these limits. Hamstrings, supine  Lie on your back (supine position). Loop a belt, towel, or exercise band over  the ball of your left / right foot. The ball of your foot is on the walking surface, right under your toes. Straighten your left / right knee and slowly pull on the belt, towel, or band to raise your leg until you feel a gentle stretch behind your knee (hamstring). Do not let your knee bend while you do this. Keep your other leg flat on the floor. Hold this position for __________ seconds. Slowly  return your leg to the starting position. Repeat __________ times. Complete this exercise __________ times a day. Hip rotation  Lie on your back on a firm surface. With your left / right hand, gently pull your left / right knee toward the shoulder that is on the same side of the body. Stop when your knee is pointing toward the ceiling. Hold your left / right ankle with your other hand. Keeping your knee steady, gently pull your left / right ankle toward your other shoulder until you feel a stretch in your butt. Keep your hips and shoulders firmly planted while you do this stretch. Hold this position for __________ seconds. Repeat __________ times. Complete this exercise __________ times a day. Seated stretch This exercise is sometimes called hamstrings and adductors stretch. Sit on the floor with your legs stretched wide. Keep your knees straight during this exercise. Keeping your head and back in a straight line, bend at your waist to reach for your left foot (position A). You should feel a stretch in your right inner thigh (adductors). Hold this position for __________ seconds. Then slowly return to the upright position. Keeping your head and back in a straight line, bend at your waist to reach forward (position B). You should feel a stretch behind both of your thighs and knees (hamstrings). Hold this position for __________ seconds. Then slowly return to the upright position. Keeping your head and back in a straight line, bend at your waist to reach for your right foot (position C). You should feel a stretch in your left inner thigh (adductors). Hold this position for __________ seconds. Then slowly return to the upright position. Repeat __________ times. Complete this exercise __________ times a day. Lunge This exercise stretches the muscles of the hip (hip flexors). Place your left / right knee on the floor and bend your other knee so that is directly over your ankle. You should be  half-kneeling. Keep good posture with your head over your shoulders. Tighten your butt muscles to point your tailbone downward. This will prevent your back from arching too much. You should feel a gentle stretch in the front of your left / right thigh and hip. If you do not feel a stretch, slide your other foot forward slightly and then slowly lunge forward with your chest up until your knee once again lines up over your ankle. Make sure your tailbone continues to point downward. Hold this position for __________ seconds. Slowly return to the starting position. Repeat __________ times. Complete this exercise __________ times a day. Strengthening exercises These exercises build strength and endurance in your hip. Endurance is the ability to use your muscles for a long time, even after they get tired. Bridge This exercise strengthens the muscles of your hip (hip extensors). Lie on your back on a firm surface with your knees bent and your feet flat on the floor. Tighten your butt muscles and lift your bottom off the floor until the trunk of your body and your hips are level with your thighs. Do not arch  your back. You should feel the muscles working in your butt and the back of your thighs. If you do not feel these muscles, slide your feet 1-2 inches (2.5-5 cm) farther away from your butt. Hold this position for __________ seconds. Slowly lower your hips to the starting position. Let your muscles relax completely between repetitions. Repeat __________ times. Complete this exercise __________ times a day. Straight leg raises, side-lying This exercise strengthens the muscles that move the hip joint away from the center of the body (hip abductors). Lie on your side with your left / right leg in the top position. Lie so your head, shoulder, hip, and knee line up. You may bend your bottom knee slightly to help you balance. Roll your hips slightly forward, so your hips are stacked directly over each  other and your left / right knee is facing forward. Leading with your heel, lift your top leg 4-6 inches (10-15 cm). You should feel the muscles in your top hip lifting. Do not let your foot drift forward. Do not let your knee roll toward the ceiling. Hold this position for __________ seconds. Slowly return to the starting position. Let your muscles relax completely between repetitions. Repeat __________ times. Complete this exercise __________ times a day. Straight leg raises, side-lying This exercise strengthens the muscles that move the hip joint toward the center of the body (hip adductors). Lie on your side with your left / right leg in the bottom position. Lie so your head, shoulder, hip, and knee line up. You may place your upper foot in front to help you balance. Roll your hips slightly forward, so your hips are stacked directly over each other and your left / right knee is facing forward. Tense the muscles in your inner thigh and lift your bottom leg 4-6 inches (10-15 cm). Hold this position for __________ seconds. Slowly return to the starting position. Let your muscles relax completely between repetitions. Repeat __________ times. Complete this exercise __________ times a day. Straight leg raises, supine This exercise strengthens the muscles in the front of your thigh (quadriceps and hip flexors). Lie on your back (supine position) with your left / right leg extended and your other knee bent. Tense the muscles in the front of your left / right thigh. You should see your kneecap slide up or see increased dimpling just above your knee. Keep these muscles tight as you raise your leg 4-6 inches (10-15 cm) off the floor. Do not let your knee bend. Hold this position for __________ seconds. Keep these muscles tense as you lower your leg. Relax the muscles slowly and completely between repetitions. Repeat __________ times. Complete this exercise __________ times a day. Hip abductors,  standing This exercise strengthens the muscles that move the leg and hip joint away from the center of the body (hip abductors). Tie one end of a rubber exercise band or tubing to a secure surface, such as a chair, table, or pole. Loop the other end of the band or tubing around your left / right ankle. Keeping your ankle with the band or tubing directly opposite the secured end, step away until there is tension in the tubing or band. Hold on to a chair, table, or pole as needed for balance. Lift your left / right leg out to your side. While you do this: Keep your back upright. Keep your shoulders over your hips. Keep your toes pointing forward. Make sure to use your hip muscles to slowly lift your leg. Do  not tip your body or forcefully lift your leg. Hold this position for __________ seconds. Slowly return to the starting position. Repeat __________ times. Complete this exercise __________ times a day. Squats This exercise strengthens the muscles in the front of your thigh (quadriceps). Stand in front of a table, or stand in a doorframe so your feet and knees are in line with the frame. You may place your hands on the table or frame for balance. Slowly bend your knees and lower your hips like you are going to sit in a chair. Keep your lower legs in a straight up-and-down position. Do not let your hips go lower than your knees. Do not bend your knees lower than told by your provider. If your hip pain increases, do not bend as low. Hold this position for ___________ seconds. Slowly push with your legs to return to standing. Do not use your hands to pull yourself to standing. Repeat __________ times. Complete this exercise __________ times a day. This information is not intended to replace advice given to you by your health care provider. Make sure you discuss any questions you have with your health care provider. Document Revised: 12/27/2021 Document Reviewed: 12/27/2021 Elsevier Patient  Education  2024 [employer].

## 2024-05-30 ENCOUNTER — Ambulatory Visit: Payer: Self-pay | Admitting: Rheumatology

## 2024-05-30 LAB — COMPREHENSIVE METABOLIC PANEL WITH GFR
AG Ratio: 1.9 (calc) (ref 1.0–2.5)
ALT: 20 U/L (ref 9–46)
AST: 18 U/L (ref 10–35)
Albumin: 4.5 g/dL (ref 3.6–5.1)
Alkaline phosphatase (APISO): 58 U/L (ref 35–144)
BUN: 24 mg/dL (ref 7–25)
CO2: 28 mmol/L (ref 20–32)
Calcium: 9.2 mg/dL (ref 8.6–10.3)
Chloride: 105 mmol/L (ref 98–110)
Creat: 1.04 mg/dL (ref 0.70–1.35)
Globulin: 2.4 g/dL (ref 1.9–3.7)
Glucose, Bld: 101 mg/dL — ABNORMAL HIGH (ref 65–99)
Potassium: 4.2 mmol/L (ref 3.5–5.3)
Sodium: 141 mmol/L (ref 135–146)
Total Bilirubin: 0.6 mg/dL (ref 0.2–1.2)
Total Protein: 6.9 g/dL (ref 6.1–8.1)
eGFR: 78 mL/min/1.73m2

## 2024-05-30 LAB — CBC WITH DIFFERENTIAL/PLATELET
Absolute Lymphocytes: 1440 {cells}/uL (ref 850–3900)
Absolute Monocytes: 558 {cells}/uL (ref 200–950)
Basophils Absolute: 48 {cells}/uL (ref 0–200)
Basophils Relative: 0.8 %
Eosinophils Absolute: 168 {cells}/uL (ref 15–500)
Eosinophils Relative: 2.8 %
HCT: 43.3 % (ref 39.4–51.1)
Hemoglobin: 14.5 g/dL (ref 13.2–17.1)
MCH: 31.8 pg (ref 27.0–33.0)
MCHC: 33.5 g/dL (ref 31.6–35.4)
MCV: 95 fL (ref 81.4–101.7)
MPV: 10.5 fL (ref 7.5–12.5)
Monocytes Relative: 9.3 %
Neutro Abs: 3786 {cells}/uL (ref 1500–7800)
Neutrophils Relative %: 63.1 %
Platelets: 232 Thousand/uL (ref 140–400)
RBC: 4.56 Million/uL (ref 4.20–5.80)
RDW: 13.1 % (ref 11.0–15.0)
Total Lymphocyte: 24 %
WBC: 6 Thousand/uL (ref 3.8–10.8)

## 2024-10-30 ENCOUNTER — Ambulatory Visit: Admitting: Rheumatology
